# Patient Record
Sex: Female | Born: 1965 | Race: White | Hispanic: No | Marital: Married | Smoking: Former smoker
Health system: Southern US, Community
[De-identification: ages and names within clinical notes are randomized; demographics above are authoritative.]

## PROBLEM LIST (undated history)

## (undated) DIAGNOSIS — T7840XA Allergy, unspecified, initial encounter: Secondary | ICD-10-CM

## (undated) DIAGNOSIS — F329 Major depressive disorder, single episode, unspecified: Secondary | ICD-10-CM

## (undated) DIAGNOSIS — F32A Depression, unspecified: Secondary | ICD-10-CM

## (undated) DIAGNOSIS — E119 Type 2 diabetes mellitus without complications: Secondary | ICD-10-CM

## (undated) DIAGNOSIS — H269 Unspecified cataract: Secondary | ICD-10-CM

## (undated) DIAGNOSIS — J45909 Unspecified asthma, uncomplicated: Secondary | ICD-10-CM

## (undated) HISTORY — DX: Depression, unspecified: F32.A

## (undated) HISTORY — DX: Major depressive disorder, single episode, unspecified: F32.9

## (undated) HISTORY — DX: Allergy, unspecified, initial encounter: T78.40XA

## (undated) HISTORY — DX: Type 2 diabetes mellitus without complications: E11.9

## (undated) HISTORY — DX: Unspecified asthma, uncomplicated: J45.909

## (undated) HISTORY — PX: APPENDECTOMY: SHX54

## (undated) HISTORY — PX: EYE SURGERY: SHX253

## (undated) HISTORY — PX: TONSILLECTOMY: SUR1361

## (undated) HISTORY — DX: Unspecified cataract: H26.9

---

## 2005-06-05 ENCOUNTER — Ambulatory Visit: Payer: Self-pay | Admitting: Internal Medicine

## 2011-11-25 ENCOUNTER — Ambulatory Visit: Payer: Self-pay | Admitting: Ophthalmology

## 2011-11-25 LAB — PREGNANCY, URINE: Pregnancy Test, Urine: NEGATIVE m[IU]/mL

## 2013-02-18 ENCOUNTER — Ambulatory Visit (INDEPENDENT_AMBULATORY_CARE_PROVIDER_SITE_OTHER): Payer: No Typology Code available for payment source | Admitting: Internal Medicine

## 2013-02-18 VITALS — BP 129/85 | HR 86 | Temp 97.9°F | Resp 18 | Ht 64.5 in | Wt 194.0 lb

## 2013-02-18 DIAGNOSIS — R51 Headache: Secondary | ICD-10-CM

## 2013-02-18 DIAGNOSIS — L02818 Cutaneous abscess of other sites: Secondary | ICD-10-CM

## 2013-02-18 DIAGNOSIS — L03811 Cellulitis of head [any part, except face]: Secondary | ICD-10-CM

## 2013-02-18 DIAGNOSIS — E119 Type 2 diabetes mellitus without complications: Secondary | ICD-10-CM | POA: Insufficient documentation

## 2013-02-18 LAB — POCT CBC
Granulocyte percent: 77.4 %G (ref 37–80)
HCT, POC: 46.4 % (ref 37.7–47.9)
Hemoglobin: 14.6 g/dL (ref 12.2–16.2)
MCV: 99.1 fL — AB (ref 80–97)
POC LYMPH PERCENT: 17.2 %L (ref 10–50)
RDW, POC: 13.8 %

## 2013-02-18 LAB — POCT GLYCOSYLATED HEMOGLOBIN (HGB A1C): Hemoglobin A1C: 11.1

## 2013-02-18 MED ORDER — KETOCONAZOLE 2 % EX SHAM: MEDICATED_SHAMPOO | CUTANEOUS | Status: AC

## 2013-02-18 MED ORDER — DOXYCYCLINE HYCLATE 100 MG PO TABS: 100.0000 mg | ORAL_TABLET | Freq: Two times a day (BID) | ORAL | Status: DC

## 2013-02-18 MED ORDER — CEFTRIAXONE SODIUM 1 G IJ SOLR
1.0000 g | Freq: Once | INTRAMUSCULAR | Status: AC
  Administered 2013-02-18: 1 g via INTRAMUSCULAR

## 2013-02-18 MED ORDER — HYDROCODONE-ACETAMINOPHEN 5-325 MG PO TABS: 1.0000 | ORAL_TABLET | Freq: Four times a day (QID) | ORAL | Status: AC | PRN

## 2013-02-18 NOTE — Patient Instructions (Signed)
1500 Calorie Diabetic Diet The 1500 calorie diabetic diet limits calories to 1500 each day. Following this diet and making healthy meal choices can help improve overall health. It controls blood glucose (sugar) levels and can also help lower blood pressure and cholesterol.  SERVING SIZES Measuring foods and serving sizes helps to make sure you are getting the right amount of food. The list below tells how big or small some common serving sizes are.  1 oz.........4 stacked dice.  3 oz.........Deck of cards.  1 tsp........Tip of little finger.  1 tbs........Thumb.  2 tbs........Golf ball.   cup.......Half of a fist.  1 cup........A fist. GUIDELINES FOR CHOOSING FOODS The goal of this diet is to eat a variety of foods and limit calories to 1500 each day. This can be done by choosing foods that are low in calories and fat. The diet also suggests eating small amounts of food frequently. Doing this helps control your blood glucose levels, so they do not get too high or too low. Each meal or snack may include a protein food source to help you feel more satisfied. Try to eat about the same amount of food around the same time each day. This includes weekend days, travel days, and days off work. Space your meals about 4 to 5 hours apart, and add a snack between them, if you wish.  For example, a daily food plan could include breakfast, a morning snack, lunch, dinner, and an evening snack. Healthy meals and snacks have different types of foods, including whole grains, vegetables, fruits, lean meats, poultry, fish, and dairy products. As you plan your meals, select a variety of foods. Choose from the bread and starch, vegetable, fruit, dairy, and meat/protein groups. Examples of foods from each group are listed below, with their suggested serving sizes. Use measuring cups and spoons to become familiar with what a healthy portion looks like. Bread and Starch Each serving equals 15 grams of  carbohydrate.  1 slice bread.   bagel.   cup cold cereal (unsweetened).   cup hot cereal or mashed potatoes.  1 small potato (size of a computer mouse).   cup cooked pasta or rice.   English muffin.  1 cup broth-based soup.  3 cups of popcorn.  4 to 6 whole-wheat crackers.   cup cooked beans, peas, or corn. Vegetables Each serving equals 5 grams of carbohydrate.   cup cooked vegetables.  1 cup raw vegetables.   cup tomato or vegetable juice. Fruit Each serving equals 15 grams of carbohydrate.  1 small apple or orange.  1  cup watermelon or strawberries.   cup applesauce (no sugar added).  2 tbs raisins.   banana.   cup canned fruit, packed in water or in its own juice.   cup unsweetened fruit juice. Dairy Each serving equals 12 to 15 grams of carbohydrate.  1 cup fat-free milk.  6 oz artificially sweetened yogurt or plain yogurt.  1 cup low-fat buttermilk.  1 cup soy milk.  1 cup almond milk. Meat/Protein  1 large egg.  2 to 3 oz meat, poultry, or fish.   cup low-fat cottage cheese.  1 tbs peanut butter.  1 oz low-fat cheese.   cup tuna, packed in water.   cup tofu. Fat  1 tsp oil.  1 tsp trans-fat-free margarine.  1 tsp butter.  1 tsp mayonnaise.  2 tbs avocado.  1 tbs salad dressing.  1 tbs cream cheese.  2 tbs sour cream. SAMPLE 1500 CALORIE DIET   PLAN Breakfast   whole-wheat English muffin (1 carb serving).  1 tsp trans-fat-free margarine.  1 scrambled egg.  1 cup fat-free milk (1 carb serving).  1 small orange (1 carb serving). Lunch  Chicken wrap.  1 whole-wheat tortilla, 8-inch (1 carb servings).  2 oz chicken breast, sliced.  2 tbs low-fat salad dressing, such as [place].   cup shredded lettuce.  2 slices tomato.   cup carrot sticks.  1 small apple (1 carb serving). Afternoon Snack  3 graham cracker squares (1 carb serving).  1 tbs peanut butter. Dinner  2 oz lean  pork chop, broiled.  1 cup brown rice (3 carb servings).   cup steamed carrots.   cup green beans.  1 cup fat-free milk (1 carb serving).  1 tsp trans-fat-free margarine. Evening Snack   cup low-fat cottage cheese.  1 small peach or pear, sliced (or  cup canned in water) (1 carb serving). MEAL PLAN You can use this worksheet to help you make a daily meal plan based on the 1500 calorie diabetic diet suggestions. If you are using this plan to help you control your blood glucose, you may interchange carbohydrate containing foods (dairy, starches, and fruits). Select a variety of fresh foods of varying colors and flavors. The total amount of carbohydrate in your meals or snacks is more important than making sure you include all of the food groups every time you eat. You can choose from approximately this many of the following foods to build your day's meals:  6 Starches.  3 Vegetables.  2 Fruits.  2 Dairy.  4 to 6 oz Meat/Protein.  Up to 3 Fats. Your dietician can use this worksheet to help you decide how many servings and which types of foods are right for you. BREAKFAST Food Group and Servings / Food Choice Starch _________________________________________________________ Dairy __________________________________________________________ Fruit ___________________________________________________________ Meat/Protein____________________________________________________ Fat ____________________________________________________________ LUNCH Food Group and Servings / Food Choice  Starch _________________________________________________________ Meat/Protein ___________________________________________________ Vegetables _____________________________________________________ Fruit __________________________________________________________ Dairy __________________________________________________________ Fat ____________________________________________________________ Aura Fey Food Group and Servings / Food Choice Dairy __________________________________________________________ Starch _________________________________________________________ Meat/Protein____________________________________________________ Zada Girt ___________________________________________________________ Laural Golden Food Group and Servings / Food Choice Starch _________________________________________________________ Meat/Protein ___________________________________________________ Dairy __________________________________________________________ Vegetable ______________________________________________________ Fruit ___________________________________________________________ Fat ____________________________________________________________ Lollie Sails Food Group and Servings / Food Choice Fruit ___________________________________________________________ Meat/Protein ____________________________________________________ Dairy __________________________________________________________ Starch __________________________________________________________ DAILY TOTALS Starches _________________________ Vegetables _______________________ Fruits ____________________________ Dairy ____________________________ Meat/Protein_____________________ Fats _____________________________ Document Released: 03/17/2005 Document Revised: 11/17/2011 Document Reviewed: 07/12/2009 ExitCare Patient Information 2014 [employer]. Cellulitis Cellulitis is an infection of the skin and the tissue beneath it. The infected area is usually red and tender. Cellulitis occurs most often in the arms and lower legs.  CAUSES  Cellulitis is caused by bacteria that enter the skin through cracks or cuts in the skin. The most common types of bacteria that cause cellulitis are Staphylococcus and Streptococcus. SYMPTOMS   Redness and warmth.  Swelling.  Tenderness or pain.  Fever. DIAGNOSIS  Your caregiver can usually  determine what is wrong based on a physical exam. Blood tests may also be done. TREATMENT  Treatment usually involves taking an antibiotic medicine. HOME CARE INSTRUCTIONS   Take your antibiotics as directed. Finish them even if you start to feel better.  Keep the infected arm or leg elevated to reduce swelling.  Apply a warm cloth to the affected area up to 4 times per day to relieve pain.  Only take over-the-counter or prescription medicines for pain, discomfort, or fever as directed by your caregiver.  Keep all follow-up appointments as directed by your caregiver. SEEK MEDICAL CARE IF:   You notice red streaks coming from the infected area.  Your red area gets larger or turns dark in color.  Your bone or joint underneath the infected area becomes painful after the skin has healed.  Your infection returns in the same area or another area.  You notice a swollen bump in the infected area.  You develop new symptoms. SEEK IMMEDIATE MEDICAL CARE IF:   You have a fever.  You feel very sleepy.  You develop vomiting or diarrhea.  You have a general ill feeling (malaise) with muscle aches and pains. MAKE SURE YOU:   Understand these instructions.  Will watch your condition.  Will get help right away if you are not doing well or get worse. Document Released: 06/04/2005 Document Revised: 02/24/2012 Document Reviewed: 11/10/2011 [facility] Patient Information 2014 [place], [place].

## 2013-02-18 NOTE — Progress Notes (Signed)
  Subjective:    Patient ID: Christy Cole, female    DOB: 1966/08/26, 47 y.o.   MRN: [id number]  HPI Has past hx of staph and is diabetic on metformin. Patient here today with a rash on the back of her scalp.  Patient states it has been there for about one month.  Rash is very painful.      No fever, does have red bumps coming out in may places. Review of Systems     Objective:   Physical Exam  Vitals reviewed. Constitutional: She is oriented to person, place, and time. She appears well-developed and well-nourished.  Eyes: EOM are normal.  Pulmonary/Chest: Effort normal.  Neurological: She is alert and oriented to person, place, and time.  Skin: Rash noted.  Psychiatric: She has a normal mood and affect.   Purulent discharge from tender lesion on occipital scalp. CS done Cellulitis localized, does have satellite pustules neck and face  Results for orders placed in visit on 02/18/13  POCT CBC      Result Value Range   WBC 10.1  4.6 - 10.2 K/uL   Lymph, poc 1.7  0.6 - 3.4   POC LYMPH PERCENT 17.2  10 - 50 %L   MID (cbc) 0.5  0 - 0.9   POC MID % 5.4  0 - 12 %M   POC Granulocyte 7.8 (*) 2 - 6.9   Granulocyte percent 77.4  37 - 80 %G   RBC 4.68  4.04 - 5.48 M/uL   Hemoglobin 14.6  12.2 - 16.2 g/dL   HCT, POC 40.9  81.1 - 47.9 %   MCV 99.1 (*) 80 - 97 fL   MCH, POC 31.2  27 - 31.2 pg   MCHC 31.5 (*) 31.8 - 35.4 g/dL   RDW, POC 91.4     Platelet Count, POC 246  142 - 424 K/uL   MPV 9.9  0 - 99.8 fL  GLUCOSE, POCT (MANUAL RESULT ENTRY)      Result Value Range   POC Glucose 369 (*) 70 - 99 mg/dl  POCT GLYCOSYLATED HEMOGLOBIN (HGB A1C)      Result Value Range   Hemoglobin A1C 11.1          Assessment & Plan:  Diabetes not controlled/d glucoseMust see your MD this week Cellulitis scalp/Pain scalp Return Sunday 3pm repeat rocephin Doxycycline 100mg  bid/Vicodin 5/325/Nizoral shampoo Metformin increase to 1g bid

## 2013-02-20 ENCOUNTER — Ambulatory Visit (INDEPENDENT_AMBULATORY_CARE_PROVIDER_SITE_OTHER): Payer: No Typology Code available for payment source | Admitting: Internal Medicine

## 2013-02-20 VITALS — BP 122/70 | HR 84 | Temp 98.1°F | Resp 16 | Ht 64.0 in | Wt 194.0 lb

## 2013-02-20 DIAGNOSIS — L03818 Cellulitis of other sites: Secondary | ICD-10-CM

## 2013-02-20 DIAGNOSIS — L02811 Cutaneous abscess of head [any part, except face]: Secondary | ICD-10-CM

## 2013-02-20 MED ORDER — CEFTRIAXONE SODIUM 1 G IJ SOLR
1.0000 g | Freq: Once | INTRAMUSCULAR | Status: AC
  Administered 2013-02-20: 1 g via INTRAMUSCULAR

## 2013-02-20 NOTE — Progress Notes (Signed)
  Subjective:    Patient ID: Christy Cole, female    DOB: 1965/09/22, 47 y.o.   MRN: [id number]  HPI Feeling better, draining more today, no fever and less tender.   Review of Systems     Objective:   Physical Exam Still cpoious purulence Culture staph but not mrsa  Puncture ID/drainage improved     Assessment & Plan:  Rocephin 1g Compresses/doxy Return 48hrs/Will drain and pack if not better

## 2013-02-22 LAB — WOUND CULTURE: Gram Stain: NONE SEEN

## 2013-03-18 LAB — FUNGUS CULTURE W SMEAR: Smear Result: NONE SEEN

## 2013-05-19 ENCOUNTER — Ambulatory Visit (INDEPENDENT_AMBULATORY_CARE_PROVIDER_SITE_OTHER): Payer: No Typology Code available for payment source | Admitting: Emergency Medicine

## 2013-05-19 VITALS — BP 140/90 | HR 77 | Temp 99.0°F | Resp 16 | Ht 65.0 in | Wt 193.6 lb

## 2013-05-19 DIAGNOSIS — Z2089 Contact with and (suspected) exposure to other communicable diseases: Secondary | ICD-10-CM

## 2013-05-19 DIAGNOSIS — Z202 Contact with and (suspected) exposure to infections with a predominantly sexual mode of transmission: Secondary | ICD-10-CM

## 2013-05-19 DIAGNOSIS — A5901 Trichomonal vulvovaginitis: Secondary | ICD-10-CM

## 2013-05-19 MED ORDER — METRONIDAZOLE 500 MG PO TABS: ORAL_TABLET | ORAL | Status: DC

## 2013-05-19 NOTE — Patient Instructions (Addendum)
Trichomoniasis °Trichomoniasis is an infection, caused by the Trichomonas organism, that affects both women and men. In women, the outer female genitalia and the vagina are affected. In men, the penis is mainly affected, but the prostate and other reproductive organs can also be involved. Trichomoniasis is a sexually transmitted disease (STD) and is most often passed to another person through sexual contact. The majority of people who get trichomoniasis do so from a sexual encounter and are also at risk for other STDs. °CAUSES  °· Sexual intercourse with an infected partner. °· It can be present in swimming pools or hot tubs. °SYMPTOMS  °· Abnormal gray-green frothy vaginal discharge in women. °· Vaginal itching and irritation in women. °· Itching and irritation of the area outside the vagina in women. °· Penile discharge with or without pain in males. °· Inflammation of the urethra (urethritis), causing painful urination. °· Bleeding after sexual intercourse. °RELATED COMPLICATIONS °· Pelvic inflammatory disease. °· Infection of the uterus (endometritis). °· Infertility. °· Tubal (ectopic) pregnancy. °· It can be associated with other STDs, including gonorrhea and chlamydia, hepatitis B, and HIV. °COMPLICATIONS DURING PREGNANCY °· Early (premature) delivery. °· Premature rupture of the membranes (PROM). °· Low birth weight. °DIAGNOSIS  °· Visualization of Trichomonas under the microscope from the vagina discharge. °· Ph of the vagina greater than 4.5, tested with a test tape. °· Trich Rapid Test. °· Culture of the organism, but this is not usually needed. °· It may be found on a Pap test. °· Having a "strawberry cervix,"which means the cervix looks very red like a strawberry. °TREATMENT  °· You may be given medication to fight the infection. Inform your caregiver if you could be or are pregnant. Some medications used to treat the infection should not be taken during pregnancy. °· Over-the-counter medications or  creams to decrease itching or irritation may be recommended. °· Your sexual partner will need to be treated if infected. °HOME CARE INSTRUCTIONS  °· Take all medication prescribed by your caregiver. °· Take over-the-counter medication for itching or irritation as directed by your caregiver. °· Do not have sexual intercourse while you have the infection. °· Do not douche or wear tampons. °· Discuss your infection with your partner, as your partner may have acquired the infection from you. Or, your partner may have been the person who transmitted the infection to you. °· Have your sex partner examined and treated if necessary. °· Practice safe, informed, and protected sex. °· See your caregiver for other STD testing. °SEEK MEDICAL CARE IF:  °· You still have symptoms after you finish the medication. °· You have an oral temperature above 102° F (38.9° C). °· You develop belly (abdominal) pain. °· You have pain when you urinate. °· You have bleeding after sexual intercourse. °· You develop a rash. °· The medication makes you sick or makes you throw up (vomit). °Document Released: 02/18/2001 Document Revised: 11/17/2011 Document Reviewed: 03/16/2009 °ExitCare® Patient Information ©2014 [employer]. ° °

## 2013-05-19 NOTE — Progress Notes (Signed)
[facility] [address] [place] [address] [phone]  Date:  05/19/2013   Name:  Christy Cole   DOB:  07-16-1966   MRN:  [id number]  PCP:  No PCP Per Patient    Chief Complaint: Vaginal Itching   History of Present Illness:  Christy Cole is a 47 y.o. very pleasant female patient who presents with the following:  Patient's boyfriend and his wife have both been treated for trichomonas vaginitis.  She has a discharge and mild itching.  Has no dyspareunia.  No dysuria, urgency or frequency.  No fever or chills.  No abdominal or pelvic pain.  Denies other complaint or health concern today.   Patient Active Problem List   Diagnosis Date Noted  . Diabetes 02/18/2013    Past Medical History  Diagnosis Date  . Allergy   . Depression   . Asthma   . Cataract   . Diabetes mellitus without complication     Past Surgical History  Procedure Laterality Date  . Appendectomy    . Eye surgery      History  Substance Use Topics  . Smoking status: Former Games developer  . Smokeless tobacco: Not on file  . Alcohol Use: No    Family History  Problem Relation Age of Onset  . Hypertension Mother   . Diabetes Father     No Known Allergies  Medication list has been reviewed and updated.  Current Outpatient Prescriptions on File Prior to Visit  Medication Sig Dispense Refill  . metFORMIN (GLUCOPHAGE) 1000 MG tablet Take 1,000 mg by mouth 2 (two) times daily with a meal.      . doxycycline (VIBRA-TABS) 100 MG tablet Take 1 tablet (100 mg total) by mouth 2 (two) times daily.  20 tablet  0  . HYDROcodone-acetaminophen (NORCO/VICODIN) 5-325 MG per tablet Take 1 tablet by mouth every 6 (six) hours as needed for pain.  30 tablet  0  . ketoconazole (NIZORAL) 2 % shampoo Apply topically 2 (two) times a week.  120 mL  0   No current facility-administered medications on file prior to visit.    Review of Systems:  .previ   Physical Examination: Filed Vitals:    05/19/13 2005  BP: 140/90  Pulse: 77  Temp: 99 F (37.2 C)  Resp: 16   Filed Vitals:   05/19/13 2005  Height: 5\' 5"  (1.651 m)  Weight: 193 lb 9.6 oz (87.816 kg)   Body mass index is 32.22 kg/(m^2). Ideal Body Weight: Weight in (lb) to have BMI = 25: 149.9   GEN: WDWN, NAD, Non-toxic, Alert & Oriented x 3 HEENT: Atraumatic, Normocephalic.  Ears and Nose: No external deformity. EXTR: No clubbing/cyanosis/edema NEURO: Normal gait.  PSYCH: Normally interactive. Conversant. Not depressed or anxious appearing.  Calm demeanor.    Assessment and Plan: Trichomonas  Flagyl   Signed,  Phillips Odor, MD

## 2013-10-04 ENCOUNTER — Ambulatory Visit (INDEPENDENT_AMBULATORY_CARE_PROVIDER_SITE_OTHER): Payer: No Typology Code available for payment source | Admitting: Emergency Medicine

## 2013-10-04 VITALS — BP 124/76 | HR 110 | Temp 99.0°F | Resp 17 | Ht 63.5 in | Wt 176.0 lb

## 2013-10-04 DIAGNOSIS — M7918 Myalgia, other site: Secondary | ICD-10-CM

## 2013-10-04 DIAGNOSIS — IMO0001 Reserved for inherently not codable concepts without codable children: Secondary | ICD-10-CM

## 2013-10-04 DIAGNOSIS — L0291 Cutaneous abscess, unspecified: Secondary | ICD-10-CM

## 2013-10-04 DIAGNOSIS — L0233 Carbuncle of buttock: Secondary | ICD-10-CM

## 2013-10-04 DIAGNOSIS — L0232 Furuncle of buttock: Secondary | ICD-10-CM

## 2013-10-04 DIAGNOSIS — L0390 Cellulitis, unspecified: Secondary | ICD-10-CM

## 2013-10-04 MED ORDER — SULFAMETHOXAZOLE-TMP DS 800-160 MG PO TABS: 1.0000 | ORAL_TABLET | Freq: Two times a day (BID) | ORAL | Status: DC

## 2013-10-04 MED ORDER — ACETAMINOPHEN-CODEINE #3 300-30 MG PO TABS: 1.0000 | ORAL_TABLET | ORAL | Status: AC | PRN

## 2013-10-04 NOTE — Patient Instructions (Signed)
Abscess An abscess is an infected area that contains a collection of pus and debris.It can occur in almost any part of the body. An abscess is also known as a furuncle or boil. CAUSES  An abscess occurs when tissue gets infected. This can occur from blockage of oil or sweat glands, infection of hair follicles, or a minor injury to the skin. As the body tries to fight the infection, pus collects in the area and creates pressure under the skin. This pressure causes pain. People with weakened immune systems have difficulty fighting infections and get certain abscesses more often.  SYMPTOMS Usually an abscess develops on the skin and becomes a painful mass that is red, warm, and tender. If the abscess forms under the skin, you may feel a moveable soft area under the skin. Some abscesses break open (rupture) on their own, but most will continue to get worse without care. The infection can spread deeper into the body and eventually into the bloodstream, causing you to feel ill.  DIAGNOSIS  Your caregiver will take your medical history and perform a physical exam. A sample of fluid may also be taken from the abscess to determine what is causing your infection. TREATMENT  Your caregiver may prescribe antibiotic medicines to fight the infection. However, taking antibiotics alone usually does not cure an abscess. Your caregiver may need to make a small cut (incision) in the abscess to drain the pus. In some cases, gauze is packed into the abscess to reduce pain and to continue draining the area. HOME CARE INSTRUCTIONS   Only take over-the-counter or prescription medicines for pain, discomfort, or fever as directed by your caregiver.  If you were prescribed antibiotics, take them as directed. Finish them even if you start to feel better.  If gauze is used, follow your caregiver's directions for changing the gauze.  To avoid spreading the infection:  Keep your draining abscess covered with a  bandage.  Wash your hands well.  Do not share personal care items, towels, or whirlpools with others.  Avoid skin contact with others.  Keep your skin and clothes clean around the abscess.  Keep all follow-up appointments as directed by your caregiver. SEEK MEDICAL CARE IF:   You have increased pain, swelling, redness, fluid drainage, or bleeding.  You have muscle aches, chills, or a general ill feeling.  You have a fever. MAKE SURE YOU:   Understand these instructions.  Will watch your condition.  Will get help right away if you are not doing well or get worse. Document Released: 06/04/2005 Document Revised: 02/24/2012 Document Reviewed: 11/07/2011 Exit[employer] Patient Information 2014 [employer].  

## 2013-10-04 NOTE — Progress Notes (Signed)
I was directly involved in the patient's care and actively participated during the procedure.  

## 2013-10-04 NOTE — Progress Notes (Signed)
[facility] [address], [place] [id number] [phone]  Date:  10/04/2013   Name:  Christy Cole   DOB:  23-Apr-1966   MRN:  [id number]  PCP:  No PCP Per Patient    Chief Complaint: Headache, Nausea, Emesis, Generalized Body Aches and boil on back of leg   History of Present Illness:  Christy Cole is a 48 y.o. very pleasant female patient who presents with the following:  Last Wednesday developed a red, tender area on the left buttock.  Has worsened, with fever, myalgias and arthralgias and fatigue.  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  Concerned she may have the flu. No improvement with over the counter medications or other home remedies. Denies other complaint or health concern today.   Patient Active Problem List   Diagnosis Date Noted  . Diabetes 02/18/2013    Past Medical History  Diagnosis Date  . Allergy   . Depression   . Asthma   . Cataract   . Diabetes mellitus without complication     Past Surgical History  Procedure Laterality Date  . Appendectomy    . Eye surgery      History  Substance Use Topics  . Smoking status: Former Games developermoker  . Smokeless tobacco: Not on file  . Alcohol Use: No    Family History  Problem Relation Age of Onset  . Hypertension Mother   . Diabetes Father     No Known Allergies  Medication list has been reviewed and updated.  Current Outpatient Prescriptions on File Prior to Visit  Medication Sig Dispense Refill  . doxycycline (VIBRA-TABS) 100 MG tablet Take 1 tablet (100 mg total) by mouth 2 (two) times daily.  20 tablet  0  . HYDROcodone-acetaminophen (NORCO/VICODIN) 5-325 MG per tablet Take 1 tablet by mouth every 6 (six) hours as needed for pain.  30 tablet  0  . ketoconazole (NIZORAL) 2 % shampoo Apply topically 2 (two) times a week.  120 mL  0  . metFORMIN (GLUCOPHAGE) 1000 MG tablet Take 1,000 mg by mouth 2 (two) times daily with a meal.      . metroNIDAZOLE (FLAGYL) 500 MG tablet Take  four tablets together in one dose.  DO NOT CONSUME ALCOHOL WHILE TAKING THIS MEDICATION.  4 tablet  0   No current facility-administered medications on file prior to visit.    Review of Systems:  As per HPI, otherwise negative.    Physical Examination: Filed Vitals:   10/04/13 1253  BP: 124/76  Pulse: 110  Temp: 99 F (37.2 C)  Resp: 17   Filed Vitals:   10/04/13 1253  Height: 5' 3.5" (1.613 m)  Weight: 176 lb (79.833 kg)   Body mass index is 30.68 kg/(m^2). Ideal Body Weight: Weight in (lb) to have BMI = 25: 143.1   GEN: WDWN, NAD, Non-toxic, Alert & Oriented x 3 HEENT: Atraumatic, Normocephalic.  Ears and Nose: No external deformity. EXTR: No clubbing/cyanosis/edema NEURO: Normal gait.  PSYCH: Normally interactive. Conversant. Not depressed or anxious appearing.  Calm demeanor.  Left buttock:  Large abscess with cellulitis surrounding on left buttock.    Assessment and Plan: Abscess Septra I & D   Signed,  Phillips OdorJeffery Anderson, MD

## 2013-10-04 NOTE — Progress Notes (Signed)
Procedure:  VCO. Local anesthesia with 2% lidocaine. #11 Blade used to make 2 cm incision into fluctuant of abscess. Copious purulent drainage expressed. Abscess pore with necrotic tissue at edges, this tissue was debrided. Wound was explored for possible loculations and packed with 1/4 inch plain. Wound tracked inferior to incision. Erythematous borders marked. Dressing placed.

## 2013-10-06 ENCOUNTER — Ambulatory Visit (INDEPENDENT_AMBULATORY_CARE_PROVIDER_SITE_OTHER): Payer: No Typology Code available for payment source | Admitting: Physician Assistant

## 2013-10-06 VITALS — BP 110/70 | HR 86 | Temp 97.6°F | Resp 18 | Ht 63.5 in | Wt 173.0 lb

## 2013-10-06 DIAGNOSIS — L03119 Cellulitis of unspecified part of limb: Secondary | ICD-10-CM

## 2013-10-06 DIAGNOSIS — L02419 Cutaneous abscess of limb, unspecified: Secondary | ICD-10-CM

## 2013-10-06 MED ORDER — DOXYCYCLINE HYCLATE 100 MG PO TABS: 100.0000 mg | ORAL_TABLET | Freq: Two times a day (BID) | ORAL | Status: AC

## 2013-10-06 NOTE — Progress Notes (Signed)
   Subjective:    Patient ID: Christy Cole, female    DOB: Jan 28, 1966, 48 y.o.   MRN: [id number]  HPI 48 year old female presents for wound check s/p I&D of a buttocks abscess on 10/04/13.  Noticed a lot of purulence on the bandage today but until then it had not drained much.  Reports it feels much better and the redness has decreased significantly. Pain has also improved.  Does admits she has a slight headache and has also noticed a rash around her nose and mouth that is very dry and is peeling.  No hx of drug allergies. Denies any oral lesions or rash anywhere else on her body.  No nausea, vomiting, fever or chills.     Review of Systems  Constitutional: Negative for fever and chills.  Gastrointestinal: Negative for nausea and vomiting.  Skin: Positive for rash and wound. Negative for color change.  Neurological: Positive for headaches.       Objective:   Physical Exam  Constitutional: She is oriented to person, place, and time. She appears well-developed and well-nourished.  HENT:  Head: Normocephalic and atraumatic.    Right Ear: External ear normal.  Left Ear: External ear normal.  Mouth/Throat: Oropharynx is clear and moist.  Noted area has erythematous, peeling rash. No vesicles, induration, or warmth.   Eyes: Conjunctivae are normal.  Neck: Normal range of motion.  Cardiovascular: Normal rate.   Pulmonary/Chest: Effort normal.  Neurological: She is alert and oriented to person, place, and time.  Skin:     Wound about 2 cm in size.  There is some white adherent material on inside border of wound.  Border marked with skin scribe from previous visit has no erythema up to edges. Only small amount of erythema around wound.  No induration or warmth noted.    Psychiatric: She has a normal mood and affect. Her behavior is normal. Judgment and thought content normal.    Procedure: Dressing and packing removed. Irrigated with 5 cc of 2% plain lidocaine.  Moderate amount of  purulence expressed. Wound edges debrided.  Repacked with 1/4 plain packing.  Dressing applied.       Assessment & Plan:  Cellulitis and abscess of leg - Plan: doxycycline (VIBRA-TABS) 100 MG tablet  Stop Septra due to eruption of rash.   Start doxycycline 100 mg bid x 10 days Culture still pending Continue daily dressing changes RTC precautions discussed.  Follow up in 48 hours.

## 2013-10-07 LAB — WOUND CULTURE: Gram Stain: NONE SEEN

## 2013-10-08 ENCOUNTER — Ambulatory Visit (INDEPENDENT_AMBULATORY_CARE_PROVIDER_SITE_OTHER): Payer: No Typology Code available for payment source | Admitting: Physician Assistant

## 2013-10-08 VITALS — BP 118/70 | HR 91 | Temp 97.9°F | Resp 18 | Wt 176.0 lb

## 2013-10-08 DIAGNOSIS — L03119 Cellulitis of unspecified part of limb: Principal | ICD-10-CM

## 2013-10-08 DIAGNOSIS — L02419 Cutaneous abscess of limb, unspecified: Secondary | ICD-10-CM

## 2013-10-08 DIAGNOSIS — T887XXA Unspecified adverse effect of drug or medicament, initial encounter: Secondary | ICD-10-CM

## 2013-10-08 DIAGNOSIS — T50905A Adverse effect of unspecified drugs, medicaments and biological substances, initial encounter: Secondary | ICD-10-CM

## 2013-10-08 NOTE — Progress Notes (Signed)
   Patient ID: Christy Cole MRN: [id number], DOB: 03/07/1966 48 y.o. Date of Encounter: 10/08/2013, 12:01 PM  Primary Physician: No PCP Per Patient  Chief Complaint: Wound care   See previous note  HPI: 48 y.o. female presents for wound care s/p I&D on 10/04/13 Doing well No issues or complaints Afebrile/ no chills No nausea or vomiting Septra was stopped on 10/06/13 secondary to the development of perioral erythema She was started on doxycycline on 10/06/13 Tolerating doxycyline  Pain resolved Daily dressing change Previous notes reviewed  Past Medical History  Diagnosis Date  . Allergy   . Depression   . Asthma   . Cataract   . Diabetes mellitus without complication      Home Meds: Prior to Admission medications   Medication Sig Start Date End Date Taking? Authorizing Provider  acetaminophen-codeine (TYLENOL #3) 300-30 MG per tablet Take 1-2 tablets by mouth every 4 (four) hours as needed. 10/04/13  Yes Phillips OdorJeffery Anderson, MD  doxycycline (VIBRA-TABS) 100 MG tablet Take 1 tablet (100 mg total) by mouth 2 (two) times daily. 10/06/13  Yes Heather M Marte, PA-C  metFORMIN (GLUCOPHAGE) 1000 MG tablet Take 1,000 mg by mouth 2 (two) times daily with a meal.   Yes Historical Provider, MD  HYDROcodone-acetaminophen (NORCO/VICODIN) 5-325 MG per tablet Take 1 tablet by mouth every 6 (six) hours as needed for pain. 02/18/13   Jonita Albeehris W Guest, MD  ketoconazole (NIZORAL) 2 % shampoo Apply topically 2 (two) times a week. 02/18/13   Jonita Albeehris W Guest, MD    Allergies:  Allergies  Allergen Reactions  . Bactrim [Sulfamethoxazole-Tmp Ds] Rash    ROS: Constitutional: Afebrile, no chills Cardiovascular: negative for chest pain or palpitations Dermatological: Positive for wound. Negative for erythema, pain, or warmth  GI: No nausea or vomiting   EXAM: Physical Exam: Blood pressure 118/70, pulse 91, temperature 97.9 F (36.6 C), temperature source Oral, resp. rate 18, weight 176 lb (79.833  kg), last menstrual period 10/01/2013, SpO2 97.00%., Body mass index is 30.68 kg/(m^2). General: Well developed, well nourished, in no acute distress. Nontoxic appearing. Head: Normocephalic, atraumatic, sclera non-icteric.  Neck: Supple. Lungs: Breathing is unlabored. Heart: Normal rate. Skin:  Warm and moist. Dressing and packing in place. No induration, erythema, or tenderness to palpation. Skin marker line in place. There is no erythema present. Along the periorbital region there is resolving erythema. The patient states this is improved.  Neuro: Alert and oriented X 3. Moves all extremities spontaneously. Normal gait.  Psych:  Responds to questions appropriately with a normal affect.   PROCEDURE: Dressing and packing removed. Small amount of purulence expressed Wound bed and wound edges debrided Irrigated with 1% plain lidocaine 5 cc. Repacked with 1/4 inch plain packing Dressing applied  LAB: Culture: MRSA, doxycycline sensitive  A/P: 48 y.o. female with cellulitis/abscess as above s/p I&D on 10/04/13 and resolved drug reaction  1) Wound care -Wound care per above -Continue doxycycline -Pain well controlled -Daily dressing changes -Recheck 48 hours  2) Drug reaction -Resolved -Avoid sulfa medications  Signed, Eula Listenyan Viveka Wilmeth, MHS, PA-C [facility] [place], [place] [id number] [phone] [facility] 10/08/2013 12:01 PM

## 2013-10-10 ENCOUNTER — Ambulatory Visit (INDEPENDENT_AMBULATORY_CARE_PROVIDER_SITE_OTHER): Payer: No Typology Code available for payment source | Admitting: Physician Assistant

## 2013-10-10 VITALS — BP 140/78 | HR 92 | Temp 97.4°F | Resp 16 | Ht 64.5 in | Wt 180.0 lb

## 2013-10-10 DIAGNOSIS — L03317 Cellulitis of buttock: Secondary | ICD-10-CM

## 2013-10-10 DIAGNOSIS — B373 Candidiasis of vulva and vagina: Secondary | ICD-10-CM

## 2013-10-10 DIAGNOSIS — L0231 Cutaneous abscess of buttock: Secondary | ICD-10-CM

## 2013-10-10 DIAGNOSIS — B3731 Acute candidiasis of vulva and vagina: Secondary | ICD-10-CM

## 2013-10-10 MED ORDER — FLUCONAZOLE 150 MG PO TABS: 150.0000 mg | ORAL_TABLET | Freq: Once | ORAL | Status: AC

## 2013-10-10 NOTE — Progress Notes (Signed)
   Subjective:    Patient ID: Christy Cole, female    DOB: [birth year], 48 y.o.   MRN: [id number]  HPI   Christy Cole is a very pleasant 48 yr old female here for wound care following I&D of a buttock abscess on 10/04/13.  Pt states she is much improved.  Changed from Septra to doxy at last visit due to probable drug reaction.  Pt tolerating this well but has developed a yeast infection.  The area continues to drain, and she continues to change the dressing frequently.   Review of Systems  Constitutional: Negative for fever and chills.  Respiratory: Negative.   Cardiovascular: Negative.   Gastrointestinal: Negative.   Genitourinary: Positive for vaginal discharge.  Skin: Positive for wound. Negative for rash.       Objective:   Physical Exam  Vitals reviewed. Constitutional: She is oriented to person, place, and time. She appears well-developed and well-nourished. No distress.  HENT:  Head: Normocephalic and atraumatic.  Eyes: Conjunctivae are normal. No scleral icterus.  Pulmonary/Chest: Effort normal.  Neurological: She is alert and oriented to person, place, and time.  Skin: Skin is warm and dry.     Healing abscess at left buttock; no surrounding erythema, induration or warmth; initial cellulitis outline still visible and erythema has resolved completely; wound bed healthy; continues to drain thin purulent/serous drainage  Psychiatric: She has a normal mood and affect. Her behavior is normal.        Assessment & Plan:  Vaginal candidiasis - Plan: fluconazole (DIFLUCAN) 150 MG tablet  Cellulitis and abscess of buttock   Christy Cole is a very pleasant 48 yr old female here for wound care following I&D.  The wound appears to be healing very well.  Cellulitis has completely resolved.  At this point I do not think the wound requires further packing.  Continue frequent dressing changes until completely healed.  Finish abx.  RTC if concerns   E. Frances FurbishElizabeth Donelda Mailhot MHS, PA-C [facility] 2/2/20151:25 PM

## 2014-12-31 NOTE — Op Note (Signed)
PATIENT NAME:  Christy Cole, Christy Cole MR#:  [id number] DATE OF BIRTH:  11/11/1965  DATE OF PROCEDURE:  11/25/2011  PREOPERATIVE DIAGNOSIS: Visually significant cataract of the right eye.   POSTOPERATIVE DIAGNOSIS: Visually significant cataract of the right eye.   OPERATIVE PROCEDURE: Cataract extraction by phacoemulsification with implant of intraocular lens to right eye.   SURGEON: Galen ManilaWilliam Kinlee Garrison, MD.   ANESTHESIA:  1. Managed anesthesia care.  2. Topical tetracaine drops followed by 2% Xylocaine jelly applied in the preoperative holding area.   COMPLICATIONS: None.   TECHNIQUE:  Four quadrant divide-and-conquer    DESCRIPTION OF PROCEDURE: The patient was examined and consented in the preoperative holding area where the aforementioned topical anesthesia was applied to the right eye and then brought back to the Operating Room where the right eye was prepped and draped in the usual sterile ophthalmic fashion and a lid speculum was placed. A paracentesis was created with the side port blade and the anterior chamber was filled with viscoelastic. A near clear corneal incision was performed with the steel keratome. A continuous curvilinear capsulorrhexis was performed with a cystotome followed by the capsulorrhexis forceps. Hydrodissection and hydrodelineation were carried out with BSS on a blunt cannula. The lens was removed in a four quadrant divide-and-conquer technique and the remaining cortical material was removed with the irrigation-aspiration handpiece. The capsular bag was inflated with viscoelastic and the Technus ZCB00 20.0-diopter lens, serial number [id number] was placed in the capsular bag without complication. The remaining viscoelastic was removed from the eye with the irrigation-aspiration handpiece. The wounds were hydrated. The anterior chamber was flushed with Miostat and the eye was inflated to physiologic pressure. The wounds were found to be water tight. The eye was dressed with  Vigamox. The patient was given protective glasses to wear throughout the day and a shield with which to sleep tonight. The patient was also given drops with which to begin a drop regimen today and will follow-up with me in one day.   ____________________________ Christy FieldWilliam L. Hanah Moultry, MD wlp:drc D: 11/25/2011 11:54:00 ET T: 11/25/2011 12:09:29 ET JOB#: [id number]  cc: Christy Cole L. Harland Aguiniga, MD, <Dictator> Christy FieldWILLIAM L Ananda Sitzer MD ELECTRONICALLY SIGNED 11/27/2011 10:36

## 2016-01-09 ENCOUNTER — Other Ambulatory Visit: Payer: Self-pay | Admitting: Family

## 2016-01-09 DIAGNOSIS — Z1231 Encounter for screening mammogram for malignant neoplasm of breast: Secondary | ICD-10-CM

## 2016-01-23 ENCOUNTER — Ambulatory Visit: Payer: Self-pay

## 2017-07-25 ENCOUNTER — Emergency Department (HOSPITAL_COMMUNITY): Payer: Managed Care, Other (non HMO)

## 2017-07-25 ENCOUNTER — Observation Stay (HOSPITAL_COMMUNITY)
Admission: EM | Admit: 2017-07-25 | Discharge: 2017-07-27 | Disposition: A | Discharge status: Home / Self Care | Payer: Managed Care, Other (non HMO) | Attending: Internal Medicine | Admitting: Internal Medicine

## 2017-07-25 ENCOUNTER — Encounter (HOSPITAL_COMMUNITY): Payer: Self-pay

## 2017-07-25 ENCOUNTER — Other Ambulatory Visit: Payer: Self-pay

## 2017-07-25 DIAGNOSIS — Z794 Long term (current) use of insulin: Secondary | ICD-10-CM | POA: Diagnosis not present

## 2017-07-25 DIAGNOSIS — F1410 Cocaine abuse, uncomplicated: Secondary | ICD-10-CM | POA: Diagnosis not present

## 2017-07-25 DIAGNOSIS — N179 Acute kidney failure, unspecified: Secondary | ICD-10-CM | POA: Diagnosis not present

## 2017-07-25 DIAGNOSIS — R51 Headache: Secondary | ICD-10-CM | POA: Diagnosis not present

## 2017-07-25 DIAGNOSIS — E1110 Type 2 diabetes mellitus with ketoacidosis without coma: Principal | ICD-10-CM | POA: Insufficient documentation

## 2017-07-25 DIAGNOSIS — E1310 Other specified diabetes mellitus with ketoacidosis without coma: Secondary | ICD-10-CM | POA: Diagnosis not present

## 2017-07-25 DIAGNOSIS — Z79899 Other long term (current) drug therapy: Secondary | ICD-10-CM | POA: Insufficient documentation

## 2017-07-25 LAB — CBC
HCT: 51.5 % — ABNORMAL HIGH (ref 36.0–46.0)
HEMATOCRIT: 45 % (ref 36.0–46.0)
Hemoglobin: 17.2 g/dL — ABNORMAL HIGH (ref 12.0–15.0)
Hemoglobin: 15.6 g/dL — ABNORMAL HIGH (ref 12.0–15.0)
MCH: 30 pg (ref 26.0–34.0)
MCH: 30.1 pg (ref 26.0–34.0)
MCHC: 33.4 g/dL (ref 30.0–36.0)
MCHC: 34.7 g/dL (ref 30.0–36.0)
MCV: 89.9 fL (ref 78.0–100.0)
MCV: 86.9 fL (ref 78.0–100.0)
PLATELETS: 360 10*3/uL (ref 150–400)
Platelets: 314 10*3/uL (ref 150–400)
RBC: 5.73 MIL/uL — AB (ref 3.87–5.11)
RBC: 5.18 MIL/uL — ABNORMAL HIGH (ref 3.87–5.11)
RDW: 13.3 % (ref 11.5–15.5)
RDW: 13.1 % (ref 11.5–15.5)
WBC: 14.8 10*3/uL — AB (ref 4.0–10.5)
WBC: 16.6 10*3/uL — ABNORMAL HIGH (ref 4.0–10.5)

## 2017-07-25 LAB — I-STAT CG4 LACTIC ACID, ED
Lactic Acid, Venous: 2.17 mmol/L (ref 0.5–1.9)
Lactic Acid, Venous: 1.78 mmol/L (ref 0.5–1.9)

## 2017-07-25 LAB — CBG MONITORING, ED
GLUCOSE-CAPILLARY: 352 mg/dL — AB (ref 65–99)
GLUCOSE-CAPILLARY: 292 mg/dL — AB (ref 65–99)
Glucose-Capillary: 220 mg/dL — ABNORMAL HIGH (ref 65–99)
Glucose-Capillary: 436 mg/dL — ABNORMAL HIGH (ref 65–99)
Glucose-Capillary: 511 mg/dL (ref 65–99)
Glucose-Capillary: 563 mg/dL (ref 65–99)
Glucose-Capillary: >600 mg/dL (ref 65–99)
Glucose-Capillary: >600 mg/dL (ref 65–99)

## 2017-07-25 LAB — I-STAT VENOUS BLOOD GAS, ED
Acid-base deficit: 14 mmol/L — ABNORMAL HIGH (ref 0.0–2.0)
Bicarbonate: 11.6 mmol/L — ABNORMAL LOW (ref 20.0–28.0)
O2 SAT: 70 %
PCO2 VEN: 28.1 mmHg — AB (ref 44.0–60.0)
TCO2: 12 mmol/L — ABNORMAL LOW (ref 22–32)
pH, Ven: 7.223 — ABNORMAL LOW (ref 7.250–7.430)
pO2, Ven: 43 mmHg (ref 32.0–45.0)

## 2017-07-25 LAB — BASIC METABOLIC PANEL
Anion gap: 15 (ref 5–15)
BUN: 45 mg/dL — AB (ref 6–20)
CHLORIDE: 112 mmol/L — AB (ref 101–111)
CO2: 14 mmol/L — AB (ref 22–32)
Calcium: 8.8 mg/dL — ABNORMAL LOW (ref 8.9–10.3)
Creatinine, Ser: 1.46 mg/dL — ABNORMAL HIGH (ref 0.44–1.00)
GFR calc Af Amer: 46 mL/min — ABNORMAL LOW (ref >60)
GFR, EST NON AFRICAN AMERICAN: 40 mL/min — AB (ref >60)
GLUCOSE: 319 mg/dL — AB (ref 65–99)
POTASSIUM: 4.1 mmol/L (ref 3.5–5.1)
Sodium: 141 mmol/L (ref 135–145)

## 2017-07-25 LAB — I-STAT TROPONIN, ED: TROPONIN I, POC: 0.03 ng/mL (ref 0.00–0.08)

## 2017-07-25 LAB — COMPREHENSIVE METABOLIC PANEL
ALT: 16 U/L (ref 14–54)
AST: 11 U/L — ABNORMAL LOW (ref 15–41)
Albumin: 3.9 g/dL (ref 3.5–5.0)
Alkaline Phosphatase: 134 U/L — ABNORMAL HIGH (ref 38–126)
Anion gap: 24 — ABNORMAL HIGH (ref 5–15)
BUN: 39 mg/dL — ABNORMAL HIGH (ref 6–20)
CO2: 12 mmol/L — AB (ref 22–32)
CREATININE: 2.01 mg/dL — AB (ref 0.44–1.00)
Calcium: 9.1 mg/dL (ref 8.9–10.3)
Chloride: 99 mmol/L — ABNORMAL LOW (ref 101–111)
GFR, EST AFRICAN AMERICAN: 31 mL/min — AB (ref >60)
GFR, EST NON AFRICAN AMERICAN: 27 mL/min — AB (ref >60)
Glucose, Bld: 811 mg/dL (ref 65–99)
Potassium: 5.1 mmol/L (ref 3.5–5.1)
Sodium: 135 mmol/L (ref 135–145)
Total Bilirubin: 1.9 mg/dL — ABNORMAL HIGH (ref 0.3–1.2)
Total Protein: 8.4 g/dL — ABNORMAL HIGH (ref 6.5–8.1)

## 2017-07-25 LAB — RAPID URINE DRUG SCREEN, HOSP PERFORMED
Amphetamines: NOT DETECTED
BARBITURATES: NOT DETECTED
Benzodiazepines: NOT DETECTED
Cocaine: POSITIVE — AB
Opiates: NOT DETECTED
TETRAHYDROCANNABINOL: NOT DETECTED

## 2017-07-25 LAB — HEMOGLOBIN A1C
HEMOGLOBIN A1C: 12.9 % — AB (ref 4.8–5.6)
MEAN PLASMA GLUCOSE: 323.53 mg/dL

## 2017-07-25 LAB — I-STAT CHEM 8, ED
BUN: 42 mg/dL — AB (ref 6–20)
BUN: 43 mg/dL — AB (ref 6–20)
CALCIUM ION: 1.11 mmol/L — AB (ref 1.15–1.40)
CALCIUM ION: 1.12 mmol/L — AB (ref 1.15–1.40)
CHLORIDE: 112 mmol/L — AB (ref 101–111)
CREATININE: 1.1 mg/dL — AB (ref 0.44–1.00)
Chloride: 105 mmol/L (ref 101–111)
Creatinine, Ser: 1 mg/dL (ref 0.44–1.00)
GLUCOSE: 696 mg/dL — AB (ref 65–99)
HCT: 52 % — ABNORMAL HIGH (ref 36.0–46.0)
HCT: 47 % — ABNORMAL HIGH (ref 36.0–46.0)
HEMOGLOBIN: 17.7 g/dL — AB (ref 12.0–15.0)
Hemoglobin: 16 g/dL — ABNORMAL HIGH (ref 12.0–15.0)
Potassium: 5.2 mmol/L — ABNORMAL HIGH (ref 3.5–5.1)
Potassium: 4.8 mmol/L (ref 3.5–5.1)
SODIUM: 142 mmol/L (ref 135–145)
Sodium: 136 mmol/L (ref 135–145)
TCO2: 12 mmol/L — AB (ref 22–32)
TCO2: 13 mmol/L — ABNORMAL LOW (ref 22–32)

## 2017-07-25 LAB — I-STAT BETA HCG BLOOD, ED (MC, WL, AP ONLY): I-stat hCG, quantitative: <5 m[IU]/mL (ref <5)

## 2017-07-25 LAB — LIPASE, BLOOD: LIPASE: 25 U/L (ref 11–51)

## 2017-07-25 LAB — MAGNESIUM: Magnesium: 2.1 mg/dL (ref 1.7–2.4)

## 2017-07-25 LAB — BETA-HYDROXYBUTYRIC ACID: Beta-Hydroxybutyric Acid: >4.5 mmol/L — ABNORMAL HIGH (ref 0.05–0.27)

## 2017-07-25 MED ORDER — SODIUM CHLORIDE 0.9 % IV BOLUS (SEPSIS)
1000.0000 mL | Freq: Once | INTRAVENOUS | Status: AC
  Administered 2017-07-25: 1000 mL via INTRAVENOUS

## 2017-07-25 MED ORDER — ENOXAPARIN SODIUM 40 MG/0.4ML ~~LOC~~ SOLN
40.0000 mg | SUBCUTANEOUS | Status: DC
  Administered 2017-07-25 – 2017-07-26 (×2): 40 mg via SUBCUTANEOUS
  Filled 2017-07-25 (×2): qty 0.4

## 2017-07-25 MED ORDER — ACETAMINOPHEN 650 MG RE SUPP: 650.0000 mg | Freq: Four times a day (QID) | RECTAL | Status: DC | PRN

## 2017-07-25 MED ORDER — ONDANSETRON HCL 4 MG PO TABS: 4.0000 mg | ORAL_TABLET | Freq: Four times a day (QID) | ORAL | Status: DC | PRN

## 2017-07-25 MED ORDER — ONDANSETRON HCL 4 MG/2ML IJ SOLN
4.0000 mg | Freq: Four times a day (QID) | INTRAMUSCULAR | Status: DC | PRN
  Administered 2017-07-26: 4 mg via INTRAVENOUS
  Filled 2017-07-25: qty 2

## 2017-07-25 MED ORDER — POTASSIUM CHLORIDE 10 MEQ/100ML IV SOLN
10.0000 meq | INTRAVENOUS | Status: AC
  Administered 2017-07-25 (×2): 10 meq via INTRAVENOUS
  Filled 2017-07-25 (×2): qty 100

## 2017-07-25 MED ORDER — DEXTROSE-NACL 5-0.45 % IV SOLN
INTRAVENOUS | Status: DC
  Administered 2017-07-25 – 2017-07-26 (×2): via INTRAVENOUS

## 2017-07-25 MED ORDER — ONDANSETRON 4 MG PO TBDP
4.0000 mg | ORAL_TABLET | Freq: Once | ORAL | Status: AC | PRN
  Administered 2017-07-25: 4 mg via ORAL
  Filled 2017-07-25: qty 1

## 2017-07-25 MED ORDER — SODIUM CHLORIDE 0.9 % IV SOLN
INTRAVENOUS | Status: DC
  Administered 2017-07-26: 5.4 [IU]/h via INTRAVENOUS
  Filled 2017-07-25: qty 1

## 2017-07-25 MED ORDER — SODIUM CHLORIDE 0.9 % IV SOLN
INTRAVENOUS | Status: DC
  Administered 2017-07-25: 22:00:00 via INTRAVENOUS

## 2017-07-25 MED ORDER — ACETAMINOPHEN 325 MG PO TABS
650.0000 mg | ORAL_TABLET | Freq: Four times a day (QID) | ORAL | Status: DC | PRN
  Administered 2017-07-26 (×2): 650 mg via ORAL
  Filled 2017-07-25 (×2): qty 2

## 2017-07-25 MED ORDER — SODIUM CHLORIDE 0.9 % IV SOLN
INTRAVENOUS | Status: DC
  Administered 2017-07-25: 18:00:00 via INTRAVENOUS

## 2017-07-25 MED ORDER — SODIUM CHLORIDE 0.9 % IV SOLN
INTRAVENOUS | Status: DC
  Administered 2017-07-25: 5.4 [IU]/h via INTRAVENOUS
  Filled 2017-07-25: qty 1

## 2017-07-25 NOTE — ED Notes (Signed)
Patient transported to X-ray 

## 2017-07-25 NOTE — ED Triage Notes (Signed)
Pt arrived POV c/o vomiting for the last 24 hours.  Denies abdominal pain, SOB.

## 2017-07-25 NOTE — ED Notes (Signed)
EDP aware of pts glucose and lactic acid level

## 2017-07-25 NOTE — H&P (Signed)
History and Physical    Maple MirzaSherry Bayman YNW:[id number] DOB: 18-May-1966 DOA: 07/25/2017  PCP: Patient, No Pcp Per  Patient coming from:  home  Chief Complaint:  n/v  HPI: Maple MirzaSherry Mattes is a 51 y.o. female with medical history significant of DM off meds for over 2 years comes in with several days of nausea and vomiting and not feeling well.  No fevers.  No respiratory or urinary symptoms.  No blood in vomit.  No abdominal pain.  She stopped taking her meds 2 years ago and has not followed up with a doctor since.  No rashes.  She has been having a headache and been using a kerosene heater in her home.  Pt found to have sugar over 700 and gap of 24.  Referred for admission for DKA.  Review of Systems: As per HPI otherwise 10 point review of systems negative.   Past Medical History:  Diagnosis Date  . Diabetes mellitus without complication [facility](HCC)     Past Surgical History:  Procedure Laterality Date  . APPENDECTOMY    . TONSILLECTOMY       has no tobacco, alcohol, and drug history on file.  none  Allergies  Allergen Reactions  . Sulfa Antibiotics Rash    Rash around mouth    No family history on file.  No premature CAD  Prior to Admission medications   Medication Sig Start Date End Date Taking? Authorizing Provider  albuterol (PROVENTIL HFA;VENTOLIN HFA) 108 (90 Base) MCG/ACT inhaler Inhale every 6 (six) hours as needed into the lungs for wheezing or shortness of breath.   Yes [provider]  Aspirin-Salicylamide-Caffeine (BC HEADACHE POWDER PO) Take 1 packet 2 (two) times daily as needed by mouth (headache).   Yes [provider]    Physical Exam: Vitals:   07/25/17 1645 07/25/17 1715 07/25/17 1756 07/25/17 1800  BP: (!) 145/78 125/72  115/65  Pulse:  (!) 113    Resp:  19  (!) 23  Temp:   99.3 F (37.4 C)   TempSrc:   Rectal   SpO2:  98%    Weight:      Height:          Constitutional: NAD, calm, comfortable Vitals:   07/25/17 1645 07/25/17 1715  07/25/17 1756 07/25/17 1800  BP: (!) 145/78 125/72  115/65  Pulse:  (!) 113    Resp:  19  (!) 23  Temp:   99.3 F (37.4 C)   TempSrc:   Rectal   SpO2:  98%    Weight:      Height:       Eyes: PERRL, lids and conjunctivae normal ENMT: Mucous membranes are moist. Posterior pharynx clear of any exudate or lesions.Normal dentition.  Neck: normal, supple, no masses, no thyromegaly Respiratory: clear to auscultation bilaterally, no wheezing, no crackles. Normal respiratory effort. No accessory muscle use.  Cardiovascular: Regular rate and rhythm, no murmurs / rubs / gallops. No extremity edema. 2+ pedal pulses. No carotid bruits.  Abdomen: no tenderness, no masses palpated. No hepatosplenomegaly. Bowel sounds positive.  Musculoskeletal: no clubbing / cyanosis. No joint deformity upper and lower extremities. Good ROM, no contractures. Normal muscle tone.  Skin: no rashes, lesions, ulcers. No induration Neurologic: CN 2-12 grossly intact. Sensation intact, DTR normal. Strength 5/5 in all 4.  Psychiatric: Normal judgment and insight. Alert and oriented x 3. Normal mood.    Labs on Admission: I have personally reviewed following labs and imaging studies  CBC: Recent Labs  Lab 07/25/17 1533 07/25/17 1626 07/25/17 1816  WBC 14.8*  --   --   HGB 17.2* 17.7* 16.0*  HCT 51.5* 52.0* 47.0*  MCV 89.9  --   --   PLT 360  --   --    Basic Metabolic Panel: Recent Labs  Lab 07/25/17 1533 07/25/17 1626 07/25/17 1816  NA 135 136 142  K 5.1 5.2* 4.8  CL 99* 105 112*  CO2 12*  --   --   GLUCOSE 811* >700* 696*  BUN 39* 42* 43*  CREATININE 2.01* 1.10* 1.00  CALCIUM 9.1  --   --    GFR: Estimated Creatinine Clearance: 63.7 mL/min (by C-G formula based on SCr of 1 mg/dL). Liver Function Tests: Recent Labs  Lab 07/25/17 1533  AST 11*  ALT 16  ALKPHOS 134*  BILITOT 1.9*  PROT 8.4*  ALBUMIN 3.9   Recent Labs  Lab 07/25/17 1533  LIPASE 25   No results for input(s): AMMONIA in the  last 168 hours. Coagulation Profile: No results for input(s): INR, PROTIME in the last 168 hours. Cardiac Enzymes: No results for input(s): CKTOTAL, CKMB, CKMBINDEX, TROPONINI in the last 168 hours. BNP (last 3 results) No results for input(s): PROBNP in the last 8760 hours. HbA1C: No results for input(s): HGBA1C in the last 72 hours. CBG: Recent Labs  Lab 07/25/17 1530 07/25/17 1719 07/25/17 1820  GLUCAP >600* >600* 563*   Lipid Profile: No results for input(s): CHOL, HDL, LDLCALC, TRIG, CHOLHDL, LDLDIRECT in the last 72 hours. Thyroid Function Tests: No results for input(s): TSH, T4TOTAL, FREET4, T3FREE, THYROIDAB in the last 72 hours. Anemia Panel: No results for input(s): VITAMINB12, FOLATE, FERRITIN, TIBC, IRON, RETICCTPCT in the last 72 hours. Urine analysis: No results found for: COLORURINE, APPEARANCEUR, LABSPEC, PHURINE, GLUCOSEU, HGBUR, BILIRUBINUR, KETONESUR, PROTEINUR, UROBILINOGEN, NITRITE, LEUKOCYTESUR Sepsis Labs: !!!!!!!!!!!!!!!!!!!!!!!!!!!!!!!!!!!!!!!!!!!! @LABRCNTIP (procalcitonin:4,lacticidven:4) )No results found for this or any previous visit (from the past 240 hour(s)).   Radiological Exams on Admission: Dg Chest 2 View  Result Date: 07/25/2017 CLINICAL DATA:  Vomiting for 24 hours. EXAM: CHEST  2 VIEW COMPARISON:  None. FINDINGS: There is a torturous thoracic aorta. The heart, hila, and mediastinum are otherwise normal. No pneumothorax. No suspicious nodules, masses, or focal infiltrates. IMPRESSION: No active cardiopulmonary disease. Electronically Signed   By: Gerome Samavid  Williams III M.D   On: 07/25/2017 17:01    EKG: Independently reviewed. Sinus tachycaradia no acute issues Case discussed with edp cxr reviewed no edema orinfiltrate   Assessment/Plan 51 yo female with previous h/o dm comes in with DKA  Principal Problem:   DKA (diabetic ketoacidoses) (HCC)- insulin drip.  Hourly glucose cks.  q 4 hour bmps until anion gap closes.  Ivf.  Ck hga1c.    Npo x low carb fluids.  Carbon minoxide level pending.  ua pending. cxr neg.  Active Problems:   AKI (acute kidney injury) (HCC)- due to volume depletion and dehydration.  Should improve with ivf.  ua pending     DVT prophylaxis:  lovenox Code Status:  full Family Communication:  husband Disposition Plan:  Per day team Consults called:  none Admission status:  stepdown   Cardale Dorer A MD [facility]  If 7PM-7AM, please contact night-coverage [email or web address] Password [email or web address]  07/25/2017, 6:36 PM

## 2017-07-25 NOTE — ED Provider Notes (Signed)
[facility] EMERGENCY DEPARTMENT Provider Note   CSN: [id number] Arrival date & time: 07/25/17  1435     History   Chief Complaint Chief Complaint  Patient presents with  . Emesis    HPI Christy Cole is a 51 y.o. female.  HPI  Patient is a 51 year old female with a history of type 2 diabetes mellitus (not currently on antihyperglycemic meds) presenting for 48 hours of emesis.  Patient reports that this began when the power went out in the 2 evenings ago and she and her partner were heating the house with kerosene and candles.  Patient reports he had a global headache without visual changes yesterday, however this is resolved today.  Patient reports in the last 24 hours she has vomited approximately 30 times.  No fevers.  Patient reports she was profusely sweating last night.  Patient denies any focal abdominal tenderness or diarrhea.  Last bowel movement yesterday and normal for patient without melena or hematochezia.  Patient reports a productive cough of sputum without hemoptysis.  No chest pain or shortness of breath currently. Patient was previously followed by PCP in [facility] for her type 2 diabetes mellitus and on glipizide, however she has not had this in over 2 years.  Past Medical History:  Diagnosis Date  . Diabetes mellitus without complication [facility])     Patient Active Problem List   Diagnosis Date Noted  . DKA (diabetic ketoacidoses) (HCC) 07/25/2017    Past Surgical History:  Procedure Laterality Date  . APPENDECTOMY    . TONSILLECTOMY      OB History    No data available       Home Medications    Prior to Admission medications   Medication Sig Start Date End Date Taking? Authorizing Provider  albuterol (PROVENTIL HFA;VENTOLIN HFA) 108 (90 Base) MCG/ACT inhaler Inhale every 6 (six) hours as needed into the lungs for wheezing or shortness of breath.   Yes [provider]  Aspirin-Salicylamide-Caffeine (BC HEADACHE POWDER PO)  Take 1 packet 2 (two) times daily as needed by mouth (headache).   Yes [provider]    Family History No family history on file.  Social History Social History   Tobacco Use  . Smoking status: Not on file  Substance Use Topics  . Alcohol use: Not on file  . Drug use: Not on file     Allergies   Sulfa antibiotics   Review of Systems Review of Systems  Constitutional: Positive for chills. Negative for fever.  HENT: Negative for congestion and rhinorrhea.   Eyes: Negative for visual disturbance.  Respiratory: Positive for cough. Negative for shortness of breath.   Cardiovascular: Negative for chest pain.  Gastrointestinal: Positive for nausea and vomiting. Negative for abdominal pain, blood in stool, constipation and diarrhea.  Genitourinary: Negative for dysuria and flank pain.  Musculoskeletal: Negative for back pain.  Skin: Negative for rash.  Neurological: Positive for dizziness, light-headedness and headaches. Negative for syncope.     Physical Exam Updated Vital Signs BP 125/72   Pulse (!) 113   Temp 99.3 F (37.4 C) (Rectal)   Resp 19   Ht 5\' 4"  (1.626 m)   Wt 73 kg (161 lb)   SpO2 98%   BMI 27.64 kg/m   Physical Exam  Constitutional: She appears well-developed. She appears distressed.  Ill-appearing and uncomfortable lying on side in the bed.  HENT:  Head: Normocephalic and atraumatic.  Mouth/Throat: Oropharynx is clear and moist.  Eyes: Conjunctivae  and EOM are normal. Pupils are equal, round, and reactive to light.  Neck: Normal range of motion. Neck supple.  Cardiovascular: Normal rate, regular rhythm, S1 normal and S2 normal.  No murmur heard. Pulmonary/Chest: Effort normal and breath sounds normal. She has no wheezes. She has no rales.  Abdominal: Soft. She exhibits no distension. There is tenderness. There is no guarding.  Diffuse discomfort to the abdomen without focal tenderness.  Musculoskeletal: Normal range of motion. She  exhibits no edema or deformity.  Lymphadenopathy:    She has no cervical adenopathy.  Neurological: She is alert.  Cranial nerves grossly intact. Patient was extremities symmetrically and with good coordination.  Skin: Skin is warm and dry. No rash noted. No erythema.  Psychiatric: She has a normal mood and affect. Her behavior is normal. Judgment and thought content normal.  Nursing note and vitals reviewed.    ED Treatments / Results  Labs (all labs ordered are listed, but only abnormal results are displayed) Labs Reviewed  COMPREHENSIVE METABOLIC PANEL - Abnormal; Notable for the following components:      Result Value   Chloride 99 (*)    CO2 12 (*)    Glucose, Bld 811 (*)    BUN 39 (*)    Creatinine, Ser 2.01 (*)    Total Protein 8.4 (*)    AST 11 (*)    Alkaline Phosphatase 134 (*)    Total Bilirubin 1.9 (*)    GFR calc non Af Amer 27 (*)    GFR calc Af Amer 31 (*)    Anion gap 24 (*)    All other components within normal limits  CBC - Abnormal; Notable for the following components:   WBC 14.8 (*)    RBC 5.73 (*)    Hemoglobin 17.2 (*)    HCT 51.5 (*)    All other components within normal limits  BETA-HYDROXYBUTYRIC ACID - Abnormal; Notable for the following components:   Beta-Hydroxybutyric Acid >4.50 (*)    All other components within normal limits  CBG MONITORING, ED - Abnormal; Notable for the following components:   Glucose-Capillary >600 (*)    All other components within normal limits  I-STAT VENOUS BLOOD GAS, ED - Abnormal; Notable for the following components:   pH, Ven 7.223 (*)    pCO2, Ven 28.1 (*)    Bicarbonate 11.6 (*)    TCO2 12 (*)    Acid-base deficit 14.0 (*)    All other components within normal limits  I-STAT CG4 LACTIC ACID, ED - Abnormal; Notable for the following components:   Lactic Acid, Venous 2.17 (*)    All other components within normal limits  I-STAT CHEM 8, ED - Abnormal; Notable for the following components:   Potassium 5.2  (*)    BUN 42 (*)    Creatinine, Ser 1.10 (*)    Glucose, Bld >700 (*)    Calcium, Ion 1.11 (*)    TCO2 12 (*)    Hemoglobin 17.7 (*)    HCT 52.0 (*)    All other components within normal limits  CBG MONITORING, ED - Abnormal; Notable for the following components:   Glucose-Capillary >600 (*)    All other components within normal limits  LIPASE, BLOOD  URINALYSIS, ROUTINE W REFLEX MICROSCOPIC  CARBON MONOXIDE, BLOOD (PERFORMED AT REF LAB)  I-STAT TROPONIN, ED  I-STAT BETA HCG BLOOD, ED ([facility], [facility], [facility] ONLY)  I-STAT CG4 LACTIC ACID, ED  I-STAT CHEM 8, ED    EKG  EKG Interpretation  Date/Time:  Saturday July 25 2017 16:18:12 EST Ventricular Rate:  109 PR Interval:    QRS Duration: 87 QT Interval:  348 QTC Calculation: 469 R Axis:   79 Text Interpretation:  Sinus tachycardia Sinus tachycardia Confirmed by Bary CastillaMackuen, Courteney ([id number]) on 07/25/2017 5:41:20 PM       Radiology Dg Chest 2 View  Result Date: 07/25/2017 CLINICAL DATA:  Vomiting for 24 hours. EXAM: CHEST  2 VIEW COMPARISON:  None. FINDINGS: There is a torturous thoracic aorta. The heart, hila, and mediastinum are otherwise normal. No pneumothorax. No suspicious nodules, masses, or focal infiltrates. IMPRESSION: No active cardiopulmonary disease. Electronically Signed   By: Gerome Samavid  Williams III M.D   On: 07/25/2017 17:01    Procedures Procedures (including critical care time)  Medications Ordered in ED Medications  insulin regular (NOVOLIN R,HUMULIN R) 100 Units in sodium chloride 0.9 % 100 mL (1 Units/mL) infusion (5.4 Units/hr Intravenous New Bag/Given 07/25/17 1717)  sodium chloride 0.9 % bolus 1,000 mL (0 mLs Intravenous Stopped 07/25/17 1757)    And  0.9 %  sodium chloride infusion ( Intravenous New Bag/Given 07/25/17 1759)  ondansetron (ZOFRAN-ODT) disintegrating tablet 4 mg (4 mg Oral Given 07/25/17 1537)     Initial Impression / Assessment and Plan / ED Course  I have reviewed the triage vital signs  and the nursing notes.  Pertinent labs & imaging results that were available during my care of the patient were reviewed by me and considered in my medical decision making (see chart for details).     Final Clinical Impressions(s) / ED Diagnoses   Final diagnoses:  Diabetic ketoacidosis without coma associated with type 2 diabetes mellitus (HCC)   Patient is ill-appearing and tachycardic, but normotensive and afebrile.  Differential diagnosis includes DKA, HHS, carbon monoxide poisoning.   Glucose 811.  Anion gap 24.  Venous blood gas demonstrates pH of 7.223.  No evidence of pneumonia on chest x-ray.  Urinalysis not back yet.  Carbon monoxide in process.  Initiated fluid bolus, maintenance fluid, and insulin drip.  6:14 PM Spoke with Dr. Onalee Huaavid in hospital medicine, patient will be admitted for DKA.  Repeat i-STAT Chem-8 ordered  to continue to monitor electrolytes.  This is a shared visit with Dr. Bary Castillaourteney MacKuen. Patient was independently evaluated by this attending physician. Attending physician consulted in evaluation and admission management.    ED Discharge Orders    None       Delia ChimesMurray, Alyssa B, PA-C 07/25/17 1904    Abelino DerrickMackuen, Courteney Lyn, MD 07/26/17 [phone]

## 2017-07-26 DIAGNOSIS — E1110 Type 2 diabetes mellitus with ketoacidosis without coma: Secondary | ICD-10-CM

## 2017-07-26 LAB — GLUCOSE, CAPILLARY
Glucose-Capillary: 233 mg/dL — ABNORMAL HIGH (ref 65–99)
Glucose-Capillary: 284 mg/dL — ABNORMAL HIGH (ref 65–99)
Glucose-Capillary: 265 mg/dL — ABNORMAL HIGH (ref 65–99)

## 2017-07-26 LAB — CBG MONITORING, ED
GLUCOSE-CAPILLARY: 126 mg/dL — AB (ref 65–99)
GLUCOSE-CAPILLARY: 142 mg/dL — AB (ref 65–99)
GLUCOSE-CAPILLARY: 153 mg/dL — AB (ref 65–99)
GLUCOSE-CAPILLARY: 161 mg/dL — AB (ref 65–99)
GLUCOSE-CAPILLARY: 162 mg/dL — AB (ref 65–99)
GLUCOSE-CAPILLARY: 197 mg/dL — AB (ref 65–99)
Glucose-Capillary: 149 mg/dL — ABNORMAL HIGH (ref 65–99)
Glucose-Capillary: 142 mg/dL — ABNORMAL HIGH (ref 65–99)
Glucose-Capillary: 172 mg/dL — ABNORMAL HIGH (ref 65–99)
Glucose-Capillary: 189 mg/dL — ABNORMAL HIGH (ref 65–99)
Glucose-Capillary: 229 mg/dL — ABNORMAL HIGH (ref 65–99)

## 2017-07-26 LAB — BASIC METABOLIC PANEL
Anion gap: 5 (ref 5–15)
Anion gap: 7 (ref 5–15)
BUN: 44 mg/dL — ABNORMAL HIGH (ref 6–20)
BUN: 31 mg/dL — AB (ref 6–20)
CHLORIDE: 112 mmol/L — AB (ref 101–111)
CHLORIDE: 110 mmol/L (ref 101–111)
CO2: 21 mmol/L — AB (ref 22–32)
CO2: 20 mmol/L — AB (ref 22–32)
CREATININE: 0.81 mg/dL (ref 0.44–1.00)
Calcium: 8.5 mg/dL — ABNORMAL LOW (ref 8.9–10.3)
Calcium: 8.5 mg/dL — ABNORMAL LOW (ref 8.9–10.3)
Creatinine, Ser: 1.07 mg/dL — ABNORMAL HIGH (ref 0.44–1.00)
GFR calc Af Amer: >60 mL/min (ref >60)
GFR calc non Af Amer: 58 mL/min — ABNORMAL LOW (ref >60)
GFR calc non Af Amer: >60 mL/min (ref >60)
GLUCOSE: 173 mg/dL — AB (ref 65–99)
Glucose, Bld: 177 mg/dL — ABNORMAL HIGH (ref 65–99)
POTASSIUM: 3.9 mmol/L (ref 3.5–5.1)
Potassium: 3.4 mmol/L — ABNORMAL LOW (ref 3.5–5.1)
SODIUM: 138 mmol/L (ref 135–145)
SODIUM: 137 mmol/L (ref 135–145)

## 2017-07-26 LAB — HIV ANTIBODY (ROUTINE TESTING W REFLEX): HIV Screen 4th Generation wRfx: NONREACTIVE

## 2017-07-26 MED ORDER — INSULIN ASPART 100 UNIT/ML ~~LOC~~ SOLN
0.0000 [IU] | Freq: Every day | SUBCUTANEOUS | Status: DC
  Administered 2017-07-26: 3 [IU] via SUBCUTANEOUS

## 2017-07-26 MED ORDER — INSULIN ASPART 100 UNIT/ML ~~LOC~~ SOLN
0.0000 [IU] | Freq: Three times a day (TID) | SUBCUTANEOUS | Status: DC
  Administered 2017-07-26: 5 [IU] via SUBCUTANEOUS

## 2017-07-26 MED ORDER — POTASSIUM CHLORIDE CRYS ER 20 MEQ PO TBCR
40.0000 meq | EXTENDED_RELEASE_TABLET | Freq: Once | ORAL | Status: AC
  Administered 2017-07-26: 40 meq via ORAL
  Filled 2017-07-26: qty 2

## 2017-07-26 MED ORDER — INSULIN GLARGINE 100 UNIT/ML ~~LOC~~ SOLN
10.0000 [IU] | Freq: Every day | SUBCUTANEOUS | Status: DC
Start: 2017-07-26 — End: 2017-07-27
  Administered 2017-07-26: 10 [IU] via SUBCUTANEOUS
  Filled 2017-07-26 (×2): qty 0.1

## 2017-07-26 MED ORDER — PROMETHAZINE HCL 25 MG/ML IJ SOLN
12.5000 mg | Freq: Four times a day (QID) | INTRAMUSCULAR | Status: DC | PRN
  Filled 2017-07-26: qty 1

## 2017-07-26 NOTE — ED Notes (Signed)
  CBG 142  

## 2017-07-26 NOTE — Progress Notes (Signed)
Pt was throwing up. Pt was given PRN zofran at 3pm with no relief. Made on call aware. PRN phenergan ordered and given. Will continue to monitor pt.

## 2017-07-26 NOTE — Progress Notes (Signed)
PROGRESS NOTE    Christy MirzaSherry Nickerson  BJY:[id number] DOB: 05-05-1966 DOA: 07/25/2017 PCP: Patient, No Pcp Per     Brief Narrative:  Christy Cole is a 51 y.o. female with medical history significant of DM off meds for over 2 years comes in with several days of nausea and vomiting and not feeling well. She stopped taking her meds 2 years ago and has not followed up with a doctor since. She has been having a headache and been using a kerosene heater in her home.  Pt found to have sugar over 700 and gap of 24.  Referred for admission for DKA.  Assessment & Plan:   Principal Problem:   DKA (diabetic ketoacidoses) (HCC) Active Problems:   AKI (acute kidney injury) (HCC)   DKA -Ha1c 12.9 -Anion gap now closed, off insulin gtt. Continue lantus, novolog sliding scale -DM coordinator consult   AKI -Resolved with IVF   Hypokalemia -Replace, trend   ?CO poisoning -Continue [place] O2 -Check blood carbon monoxide level, pending   Cocaine abuse -UDS positive for cocaine  -SW consult    DVT prophylaxis: lovenox Code Status: full Family Communication: husband over the phone Disposition Plan: pending improvement   Consultants:   None  Procedures:   None   Antimicrobials:  Anti-infectives (From admission, onward)   None       Subjective: Patient presented with chief complaint of overall malaise, nausea and vomiting.  Patient is seen in the emergency department, no new complaints.  Overall not feeling well.  Unable to verbalize any specific complaints.  Denies any chest pain or shortness of breath.  Objective: Vitals:   07/26/17 1100 07/26/17 1200 07/26/17 1300 07/26/17 1330  BP: 130/66 138/78 136/69 136/75  Pulse: 90 100 97 (!) 101  Resp: 20 16 19 19   Temp:      TempSrc:      SpO2: 98% 96% 100% 99%  Weight:      Height:        Intake/Output Summary (Last 24 hours) at 07/26/2017 1357 Last data filed at 07/26/2017 1316 Gross per 24 hour  Intake 2149.86 ml  Output -  Net  2149.86 ml   Filed Weights   07/25/17 1528  Weight: 73 kg (161 lb)    Examination:  General exam: Appears calm  Respiratory system: Clear to auscultation. Respiratory effort normal. Cardiovascular system: S1 & S2 heard, RRR. No JVD, murmurs, rubs, gallops or clicks. No pedal edema. Gastrointestinal system: Abdomen is nondistended, soft and nontender. No organomegaly or masses felt. Normal bowel sounds heard. Central nervous system: Alert. No focal neurological deficits. Extremities: Symmetric 5 x 5 power. Skin: No rashes, lesions or ulcers Psychiatry: Judgement and insight appear normal. Mood & affect appropriate.   Data Reviewed: I have personally reviewed following labs and imaging studies  CBC: Recent Labs  Lab 07/25/17 1533 07/25/17 1626 07/25/17 1816 07/25/17 2234  WBC 14.8*  --   --  16.6*  HGB 17.2* 17.7* 16.0* 15.6*  HCT 51.5* 52.0* 47.0* 45.0  MCV 89.9  --   --  86.9  PLT 360  --   --  314   Basic Metabolic Panel: Recent Labs  Lab 07/25/17 1533 07/25/17 1626 07/25/17 1816 07/25/17 2234 07/26/17 0205 07/26/17 0833  NA 135 136 142 141 138 137  K 5.1 5.2* 4.8 4.1 3.9 3.4*  CL 99* 105 112* 112* 112* 110  CO2 12*  --   --  14* 21* 20*  GLUCOSE 811* >700* 696* 319* 177*  173*  BUN 39* 42* 43* 45* 44* 31*  CREATININE 2.01* 1.10* 1.00 1.46* 1.07* 0.81  CALCIUM 9.1  --   --  8.8* 8.5* 8.5*  MG  --   --   --  2.1  --   --    GFR: Estimated Creatinine Clearance: 78.6 mL/min (by C-G formula based on SCr of 0.81 mg/dL). Liver Function Tests: Recent Labs  Lab 07/25/17 1533  AST 11*  ALT 16  ALKPHOS 134*  BILITOT 1.9*  PROT 8.4*  ALBUMIN 3.9   Recent Labs  Lab 07/25/17 1533  LIPASE 25   No results for input(s): AMMONIA in the last 168 hours. Coagulation Profile: No results for input(s): INR, PROTIME in the last 168 hours. Cardiac Enzymes: No results for input(s): CKTOTAL, CKMB, CKMBINDEX, TROPONINI in the last 168 hours. BNP (last 3 results) No  results for input(s): PROBNP in the last 8760 hours. HbA1C: Recent Labs    07/25/17 2234  HGBA1C 12.9*   CBG: Recent Labs  Lab 07/26/17 0727 07/26/17 0848 07/26/17 1031 07/26/17 1133 07/26/17 1241  GLUCAP 142* 161* 172* 189* 229*   Lipid Profile: No results for input(s): CHOL, HDL, LDLCALC, TRIG, CHOLHDL, LDLDIRECT in the last 72 hours. Thyroid Function Tests: No results for input(s): TSH, T4TOTAL, FREET4, T3FREE, THYROIDAB in the last 72 hours. Anemia Panel: No results for input(s): VITAMINB12, FOLATE, FERRITIN, TIBC, IRON, RETICCTPCT in the last 72 hours. Sepsis Labs: Recent Labs  Lab 07/25/17 1627 07/25/17 1816  LATICACIDVEN 2.17* 1.78    No results found for this or any previous visit (from the past 240 hour(s)).     Radiology Studies: Dg Chest 2 View  Result Date: 07/25/2017 CLINICAL DATA:  Vomiting for 24 hours. EXAM: CHEST  2 VIEW COMPARISON:  None. FINDINGS: There is a torturous thoracic aorta. The heart, hila, and mediastinum are otherwise normal. No pneumothorax. No suspicious nodules, masses, or focal infiltrates. IMPRESSION: No active cardiopulmonary disease. Electronically Signed   By: Gerome Samavid  Williams III M.D   On: 07/25/2017 17:01      Scheduled Meds: . enoxaparin (LOVENOX) injection  40 mg Subcutaneous Q24H  . insulin aspart  0-5 Units Subcutaneous QHS  . insulin aspart  0-9 Units Subcutaneous TID WC  . insulin glargine  10 Units Subcutaneous Daily   Continuous Infusions:   LOS: 1 day    Time spent: 40 minutes   Noralee StainJennifer Carless Slatten, DO [facility] [email or web address] Password [email or web address] 07/26/2017, 1:57 PM

## 2017-07-26 NOTE — ED Notes (Signed)
CBG 161 

## 2017-07-27 DIAGNOSIS — E1110 Type 2 diabetes mellitus with ketoacidosis without coma: Secondary | ICD-10-CM | POA: Diagnosis not present

## 2017-07-27 LAB — CBC
HCT: 43.1 % (ref 36.0–46.0)
Hemoglobin: 14.2 g/dL (ref 12.0–15.0)
MCH: 29.2 pg (ref 26.0–34.0)
MCHC: 32.9 g/dL (ref 30.0–36.0)
MCV: 88.7 fL (ref 78.0–100.0)
PLATELETS: 283 10*3/uL (ref 150–400)
RBC: 4.86 MIL/uL (ref 3.87–5.11)
RDW: 13.2 % (ref 11.5–15.5)
WBC: 9.7 10*3/uL (ref 4.0–10.5)

## 2017-07-27 LAB — GLUCOSE, CAPILLARY
GLUCOSE-CAPILLARY: 302 mg/dL — AB (ref 65–99)
GLUCOSE-CAPILLARY: 230 mg/dL — AB (ref 65–99)

## 2017-07-27 LAB — BASIC METABOLIC PANEL
ANION GAP: 13 (ref 5–15)
BUN: 14 mg/dL (ref 6–20)
CO2: 18 mmol/L — ABNORMAL LOW (ref 22–32)
Calcium: 8.6 mg/dL — ABNORMAL LOW (ref 8.9–10.3)
Chloride: 106 mmol/L (ref 101–111)
Creatinine, Ser: 0.82 mg/dL (ref 0.44–1.00)
GFR calc non Af Amer: >60 mL/min (ref >60)
GLUCOSE: 296 mg/dL — AB (ref 65–99)
POTASSIUM: 4.2 mmol/L (ref 3.5–5.1)
SODIUM: 137 mmol/L (ref 135–145)

## 2017-07-27 LAB — CARBON MONOXIDE, BLOOD (PERFORMED AT REF LAB): Carbon Monoxide, Blood: 3 % (ref 0.0–3.6)

## 2017-07-27 LAB — MAGNESIUM: MAGNESIUM: 2 mg/dL (ref 1.7–2.4)

## 2017-07-27 MED ORDER — INSULIN GLARGINE 100 UNIT/ML ~~LOC~~ SOLN
20.0000 [IU] | Freq: Every day | SUBCUTANEOUS | Status: DC
  Administered 2017-07-27: 20 [IU] via SUBCUTANEOUS
  Filled 2017-07-27: qty 0.2

## 2017-07-27 MED ORDER — INSULIN LISPRO 100 UNIT/ML ~~LOC~~ SOLN: SUBCUTANEOUS | 0 refills | Status: AC

## 2017-07-27 MED ORDER — INSULIN DETEMIR 100 UNIT/ML ~~LOC~~ SOLN: 25.0000 [IU] | Freq: Every day | SUBCUTANEOUS | 0 refills | Status: AC

## 2017-07-27 MED ORDER — BLOOD GLUCOSE METER KIT: PACK | 0 refills | Status: AC

## 2017-07-27 MED ORDER — LIVING WELL WITH DIABETES BOOK
Freq: Once | Status: AC
  Administered 2017-07-27: 13:00:00
  Filled 2017-07-27: qty 1

## 2017-07-27 MED ORDER — "INSULIN SYRINGE 31G X 5/16"" 0.3 ML MISC": 0 refills | Status: AC

## 2017-07-27 MED ORDER — INSULIN ASPART 100 UNIT/ML ~~LOC~~ SOLN
0.0000 [IU] | Freq: Three times a day (TID) | SUBCUTANEOUS | Status: DC
  Administered 2017-07-27: 11 [IU] via SUBCUTANEOUS
  Administered 2017-07-27: 5 [IU] via SUBCUTANEOUS

## 2017-07-27 NOTE — Progress Notes (Addendum)
MD- Please keep cost considerations in mind when prescribing insulin at time of d/c. Patient concerned about the costs of co-pays. Insulin vials and syringes are usually the less expensive route of administration.  Patient also needs CBG meter and supplies at time of d/c- Order # [id number]   Met with patient this AM to discuss events leading to admission.  Discussed with patient diagnosis of DKA (pathophysiology), treatment of DKA, lab results, and transition plan to SQ insulin regimen.   Patient told me she had gestational diabetes many years ago with her pregnancies and took insulin during her pregnancies.  Was diagnosed with diabetes a few years ago and was taking Glipizide and Metformin.  Also stated to me that she took Lantus insulin (in pen form) but could not remember any of the dosages of any of these medications.  Stopped all he DM meds about 2 years ago and stated she did this b/c she "felt like she knew more than the doctor did".  Stated she felt fine up until about a few weeks ago.  Endorsed polyuria, polydipsia.    Spoke with pt about diabetes.  Discussed A1C results with her and explained what an A1C is, basic pathophysiology of DM Type 2, basic home care, basic diabetes diet nutrition principles, importance of checking CBGs and maintaining good CBG control to prevent long-term and short-term complications.  Reviewed signs and symptoms of hyperglycemia and hypoglycemia and how to treat hypoglycemia and hyperglycemia at home.  Also reviewed blood sugar goals and A1c goals for home.    RNs to provide ongoing basic DM education at bedside with this patient.  Have ordered educational booklet, insulin starter kit, and DM videos.  Have also placed RD consult for DM diet education for this patient.  Verbally reviewed how to use an insulin pen with patient.  Patient stated she remembers all the steps and was able to verbally recall the steps of using an insulin pen at home with me.  Also  discussed DM diet information with patient.  Encouraged patient to avoid beverages with sugar (regular soda, sweet tea, lemonade, fruit juice) and to consume mostly water.  Discussed what foods contain carbohydrates and how carbohydrates affect the body's blood sugar levels.  Encouraged patient to be careful with her portion sizes (especially grains, starchy vegetables, and fruits).    Have asked Care management to check pt's insurance benefits to see if insulin pens will be covered by her insurance.  Patient very concerned about the cost of insulin for home.        --Will follow patient during hospitalization--  Wyn Quaker RN, MSN, CDE Diabetes Coordinator Inpatient Glycemic Control Team Team Pager: [phone] (8a-5p)

## 2017-07-27 NOTE — Care Management Note (Addendum)
Case Management Note  Patient Details  Name: Maple MirzaSherry Amesquita MRN: [id number] Date of Birth: 03-25-1966  Subjective/Objective:                    Action/Plan: Discussed insulin with MD   Cons[facility]. Appointment for Wednesday August 05, 2017 at 2 pm. Information given to patient and contact information given.   Confirmed patient does have [employer] insurance and prescription coverage. Explained if patient wanted to go else where for PCP, she can call [employer] and be provided a full list of providers in network with her insurance. If patient knows of a MD she would like to see she can call that MD office directly to see if that MD is in network and accepting new patients.   Patient voiced understanding top all of above.  Expected Discharge Date:  07/28/17               Expected Discharge Plan:  Home/Self Care  In-House Referral:     Discharge planning Services  CM Consult, Follow-up appt scheduled  Post Acute Care Choice:    Choice offered to:  Patient, Spouse  DME Arranged:    DME Agency:     HH Arranged:    HH Agency:     Status of Service:  Completed, signed off  If discussed at Long Length of Stay Meetings, dates discussed:    Additional Comments:  Kingsley PlanWile, Virginio Isidore Marie, RN 07/27/2017, 10:42 AM

## 2017-07-27 NOTE — Plan of Care (Signed)
  RD consulted for nutrition education regarding diabetes.   Lab Results  Component Value Date   HGBA1C 12.9 (H) 07/25/2017   Spoke with RN, who confirmed pt for discharge home today.   Spoke with pt and significant other at bedside. Pt shares that she "totally fell off the wagon", but is now motivated to take care of herself in regards to DM. She was able to recollect DM coordinator visit earlier this morning and teach back basic DM diet principles (plate method) to this RD. Pt acknowledges need to change her eating habits. Typical intake is 3 meals per day- Breakfast: bacon and eggs, Lunch: hot dog or hamburger with french fries, Dinner: pizza with salad. Beverage is either diet pepsi or unsweetened tea with sweet and low.   Pt reports that she attended DM education [facility] [place] when she was first diagnosed and found these classes helpful.   RD provided "Carbohydrate Counting for People with Diabetes" handout from the Academy of Nutrition and Dietetics. Discussed different food groups and their effects on blood sugar, emphasizing carbohydrate-containing foods. Provided list of carbohydrates and recommended serving sizes of common foods.  Discussed importance of controlled and consistent carbohydrate intake throughout the day. Provided examples of ways to balance meals/snacks and encouraged intake of high-fiber, whole grain complex carbohydrates. Teach back method used.  Expect fair compliance.  Body mass index is 29.44 kg/m. Pt meets criteria for overweight based on current BMI.  Current diet order is Carb Modified, patient is consuming approximately 100% of meals at this time. Labs and medications reviewed. No further nutrition interventions warranted at this time. RD contact information provided. If additional nutrition issues arise, please re-consult RD.  Hersel Mcmeen A. Mayford KnifeWilliams, RD, LDN, CDE Pager: [phone] After hours Pager: [phone]

## 2017-07-27 NOTE — Progress Notes (Signed)
Inpatient Diabetes Program Recommendations  AACE/ADA: New Consensus Statement on Inpatient Glycemic Control (2015)  Target Ranges:  Prepandial:   less than 140 mg/dL      Peak postprandial:   less than 180 mg/dL (1-2 hours)      Critically ill patients:  140 - 180 mg/dL   Results for Maple MirzaSAMS, Kariana (MRN [id number]) as of 07/27/2017 09:52  Ref. Range 07/25/2017 15:33  Sodium Latest Ref Range: 135 - 145 mmol/L 135  Potassium Latest Ref Range: 3.5 - 5.1 mmol/L 5.1  Chloride Latest Ref Range: 101 - 111 mmol/L 99 (L)  CO2 Latest Ref Range: 22 - 32 mmol/L 12 (L)  Glucose Latest Ref Range: 65 - 99 mg/dL 621811 (HH)  BUN Latest Ref Range: 6 - 20 mg/dL 39 (H)  Creatinine Latest Ref Range: 0.44 - 1.00 mg/dL 3.082.01 (H)  Calcium Latest Ref Range: 8.9 - 10.3 mg/dL 9.1  Anion gap Latest Ref Range: 5 - 15  24 (H)   Results for Maple MirzaSAMS, Tala (MRN [id number]) as of 07/27/2017 09:52  Ref. Range 07/25/2017 22:34  Hemoglobin A1C Latest Ref Range: 4.8 - 5.6 % 12.9 (H)     Admit with: DKA/ Stopped taking Diabetes Medications over 2 years ago  History: DM  Home DM Meds: None  Current Insulin Orders: Lantus 20 units daily      Novolog Moderate Correction Scale/ SSI (0-15 units) TID AC + HS      MD- Note patient transitioned to SQ Insulin yesterday.    Lantus dose increased this AM to 20 units daily.  Plan to speak with patient today.     --Will follow patient during hospitalization--  Ambrose FinlandJeannine Johnston Demerius Podolak RN, MSN, CDE Diabetes Coordinator Inpatient Glycemic Control Team Team Pager: [phone] (8a-5p)

## 2017-07-27 NOTE — Discharge Instructions (Signed)
Sugar goals for home: Before meals 80-130 mg/dl 2-Hours after meals less than 180 mg/dl   Check your blood sugar 4 times daily: before breakfast, before lunch, before dinner, before sleep. You may also check throughout the day if you feel that your blood sugar is too low or too high.   Levemir is your long-acting insulin. This should be used at the same time each day. Start with 25 units daily.   Humalog is your short-acting correction insulin. Check your blood sugar prior to your meal. Then depending on your blood sugar, inject insulin as follows: CBG 121 - 150: 2 units  CBG 151 - 200: 3 units  CBG 201 - 250: 5 units  CBG 251 - 300: 8 units  CBG 301 - 350: 11 units  CBG 351 - 400: 15 units   Keep a journal of your blood sugar results each day. Bring to your primary care physician.      Blood Glucose Monitoring, Adult Monitoring your blood sugar (glucose) helps you manage your diabetes. It also helps you and your health care provider determine how well your diabetes management plan is working. Blood glucose monitoring involves checking your blood glucose as often as directed, and keeping a record (log) of your results over time. Why should I monitor my blood glucose? Checking your blood glucose regularly can:  Help you understand how food, exercise, illnesses, and medicines affect your blood glucose.  Let you know what your blood glucose is at any time. You can quickly tell if you are having low blood glucose (hypoglycemia) or high blood glucose (hyperglycemia).  Help you and your health care provider adjust your medicines as needed.  When should I check my blood glucose? Follow instructions from your health care provider about how often to check your blood glucose. This may depend on:  The type of diabetes you have.  How well-controlled your diabetes is.  Medicines you are taking.  If you have type 1 diabetes:  Check your blood glucose at least 2 times a day.  Also  check your blood glucose: ? Before every insulin injection. ? Before and after exercise. ? Between meals. ? 2 hours after a meal. ? Occasionally between 2:00 a.m. and 3:00 a.m., as directed. ? Before potentially dangerous tasks, like driving or using heavy machinery. ? At bedtime.  You may need to check your blood glucose more often, up to 6-10 times a day: ? If you use an insulin pump. ? If you need multiple daily injections (MDI). ? If your diabetes is not well-controlled. ? If you are ill. ? If you have a history of severe hypoglycemia. ? If you have a history of not knowing when your blood glucose is getting low (hypoglycemia unawareness). If you have type 2 diabetes:  If you take insulin or other diabetes medicines, check your blood glucose at least 2 times a day.  If you are on intensive insulin therapy, check your blood glucose at least 4 times a day. Occasionally, you may also need to check between 2:00 a.m. and 3:00 a.m., as directed.  Also check your blood glucose: ? Before and after exercise. ? Before potentially dangerous tasks, like driving or using heavy machinery.  You may need to check your blood glucose more often if: ? Your medicine is being adjusted. ? Your diabetes is not well-controlled. ? You are ill. What is a blood glucose log?  A blood glucose log is a record of your blood glucose readings. It helps  you and your health care provider: ? Look for patterns in your blood glucose over time. ? Adjust your diabetes management plan as needed.  Every time you check your blood glucose, write down your result and notes about things that may be affecting your blood glucose, such as your diet and exercise for the day.  Most glucose meters store a record of glucose readings in the meter. Some meters allow you to download your records to a computer. How do I check my blood glucose? Follow these steps to get accurate readings of your blood glucose: Supplies  needed   Blood glucose meter.  Test strips for your meter. Each meter has its own strips. You must use the strips that come with your meter.  A needle to prick your finger (lancet). Do not use lancets more than once.  A device that holds the lancet (lancing device).  A journal or log book to write down your results. Procedure  Wash your hands with soap and water.  Prick the side of your finger (not the tip) with the lancet. Use a different finger each time.  Gently rub the finger until a small drop of blood appears.  Follow instructions that come with your meter for inserting the test strip, applying blood to the strip, and using your blood glucose meter.  Write down your result and any notes. Alternative testing sites  Some meters allow you to use areas of your body other than your finger (alternative sites) to test your blood.  If you think you may have hypoglycemia, or if you have hypoglycemia unawareness, do not use alternative sites. Use your finger instead.  Alternative sites may not be as accurate as the fingers, because blood flow is slower in these areas. This means that the result you get may be delayed, and it may be different from the result that you would get from your finger.  The most common alternative sites are: ? Forearm. ? Thigh. ? Palm of the hand. Additional tips  Always keep your supplies with you.  If you have questions or need help, all blood glucose meters have a 24-hour hotline number that you can call. You may also contact your health care provider.  After you use a few boxes of test strips, adjust (calibrate) your blood glucose meter by following instructions that came with your meter. This information is not intended to replace advice given to you by your health care provider. Make sure you discuss any questions you have with your health care provider. Document Released: 08/28/2003 Document Revised: 03/14/2016 Document Reviewed:  02/04/2016 Elsevier Interactive Patient Education  2017 [employer]    Diabetes Mellitus and Food It is important for you to manage your blood sugar (glucose) level. Your blood glucose level can be greatly affected by what you eat. Eating healthier foods in the appropriate amounts throughout the day at about the same time each day will help you control your blood glucose level. It can also help slow or prevent worsening of your diabetes mellitus. Healthy eating may even help you improve the level of your blood pressure and reach or maintain a healthy weight. General recommendations for healthful eating and cooking habits include:  Eating meals and snacks regularly. Avoid going long periods of time without eating to lose weight.  Eating a diet that consists mainly of plant-based foods, such as fruits, vegetables, nuts, legumes, and whole grains.  Using low-heat cooking methods, such as baking, instead of high-heat cooking methods, such as deep  frying.  Work with your dietitian to make sure you understand how to use the Nutrition Facts information on food labels. How can food affect me? Carbohydrates Carbohydrates affect your blood glucose level more than any other type of food. Your dietitian will help you determine how many carbohydrates to eat at each meal and teach you how to count carbohydrates. Counting carbohydrates is important to keep your blood glucose at a healthy level, especially if you are using insulin or taking certain medicines for diabetes mellitus. Alcohol Alcohol can cause sudden decreases in blood glucose (hypoglycemia), especially if you use insulin or take certain medicines for diabetes mellitus. Hypoglycemia can be a life-threatening condition. Symptoms of hypoglycemia (sleepiness, dizziness, and disorientation) are similar to symptoms of having too much alcohol. If your health care provider has given you approval to drink alcohol, do so in moderation and use the  following guidelines:  Women should not have more than one drink per day, and men should not have more than two drinks per day. One drink is equal to: ? 12 oz of beer. ? 5 oz of wine. ? 1 oz of hard liquor.  Do not drink on an empty stomach.  Keep yourself hydrated. Have water, diet soda, or unsweetened iced tea.  Regular soda, juice, and other mixers might contain a lot of carbohydrates and should be counted.  What foods are not recommended? As you make food choices, it is important to remember that all foods are not the same. Some foods have fewer nutrients per serving than other foods, even though they might have the same number of calories or carbohydrates. It is difficult to get your body what it needs when you eat foods with fewer nutrients. Examples of foods that you should avoid that are high in calories and carbohydrates but low in nutrients include:  Trans fats (most processed foods list trans fats on the Nutrition Facts label).  Regular soda.  Juice.  Candy.  Sweets, such as cake, pie, doughnuts, and cookies.  Fried foods.  What foods can I eat? Eat nutrient-rich foods, which will nourish your body and keep you healthy. The food you should eat also will depend on several factors, including:  The calories you need.  The medicines you take.  Your weight.  Your blood glucose level.  Your blood pressure level.  Your cholesterol level.  You should eat a variety of foods, including:  Protein. ? Lean cuts of meat. ? Proteins low in saturated fats, such as fish, egg whites, and beans. Avoid processed meats.  Fruits and vegetables. ? Fruits and vegetables that may help control blood glucose levels, such as apples, mangoes, and yams.  Dairy products. ? Choose fat-free or low-fat dairy products, such as milk, yogurt, and cheese.  Grains, bread, pasta, and rice. ? Choose whole grain products, such as multigrain bread, whole oats, and brown rice. These foods may  help control blood pressure.  Fats. ? Foods containing healthful fats, such as nuts, avocado, olive oil, canola oil, and fish.  Does everyone with diabetes mellitus have the same meal plan? Because every person with diabetes mellitus is different, there is not one meal plan that works for everyone. It is very important that you meet with a dietitian who will help you create a meal plan that is just right for you. This information is not intended to replace advice given to you by your health care provider. Make sure you discuss any questions you have with your health care provider.  Document Released: 05/22/2005 Document Revised: 01/31/2016 Document Reviewed: 07/22/2013 Elsevier Interactive Patient Education  2017 [employer].

## 2017-07-27 NOTE — Progress Notes (Signed)
Discharge home. Home discharge instruction given, no question verbalized. 

## 2017-07-27 NOTE — Discharge Summary (Signed)
Physician Discharge Summary  Christy Cole YSA:[id number] DOB: 1966/03/12 DOA: 07/25/2017  PCP: Patient, No Pcp Per  Admit date: 07/25/2017 Discharge date: 07/27/2017  Admitted From: Home Disposition:  Home  Recommendations for Outpatient Follow-up:  1. Follow up with PCP in 1 week. Appointment set up for [facility] on 11/28 at 2pm.  Discharge Condition: Stable CODE STATUS: Full  Diet recommendation: Carb modified  Brief/Interim Summary: Christy Cole a 51 y.o.femalewith medical history significant ofDM off meds for over 2 years comes in with several days of nausea and vomiting and not feeling well. She stopped taking her meds 2 years ago and has not followed up with a doctor since. She has been having a headache and been using a kerosene heater in her home. Pt found to have sugar over 700 and gap of 24. Referred for admission for DKA.  Discharge Diagnoses:  Principal Problem:   DKA (diabetic ketoacidoses) ([place]) Active Problems:   AKI (acute kidney injury) ([place])   DKA -Ha1c 12.9 -Anion gap now closed, off insulin gtt -DM coordinator consult  -Education provided. Insulin, syringe, glucometer, supplies provided   AKI -Resolved with IVF   ?CO poisoning -Blood carbon monoxide level ordered at time of discharge, pending -Low suspicion. Patient's clinical status and symptoms improved with treatment of DKA   Cocaine abuse -UDS positive for cocaine    Discharge Instructions  Discharge Instructions    Call MD for:  difficulty breathing, headache or visual disturbances   Complete by:  As directed    Call MD for:  extreme fatigue   Complete by:  As directed    Call MD for:  hives   Complete by:  As directed    Call MD for:  persistant dizziness or light-headedness   Complete by:  As directed    Call MD for:  persistant nausea and vomiting   Complete by:  As directed    Call MD for:  severe uncontrolled pain   Complete by:  As  directed    Call MD for:  temperature >100.4   Complete by:  As directed    Diet Carb Modified   Complete by:  As directed    Discharge instructions   Complete by:  As directed    You were cared for by a hospitalist during your hospital stay. If you have any questions about your discharge medications or the care you received while you were in the hospital after you are discharged, you can call the unit and asked to speak with the hospitalist on call if the hospitalist that took care of you is not available. Once you are discharged, your primary care physician will handle any further medical issues. Please note that NO REFILLS for any discharge medications will be authorized once you are discharged, as it is imperative that you return to your primary care physician (or establish a relationship with a primary care physician if you do not have one) for your aftercare needs so that they can reassess your need for medications and monitor your lab values.   Increase activity slowly   Complete by:  As directed      Allergies as of 07/27/2017      Reactions   Sulfa Antibiotics Rash   Rash around mouth      Medication List    TAKE these medications   albuterol 108 (90 Base) MCG/ACT inhaler Commonly known as:  PROVENTIL HFA;VENTOLIN HFA Inhale every 6 (six) hours as needed into  the lungs for wheezing or shortness of breath.   BC HEADACHE POWDER PO Take 1 packet 2 (two) times daily as needed by mouth (headache).   blood glucose meter kit and supplies Dispense based on patient and insurance preference. Use up to four times daily as directed. (FOR ICD-9 250.00, 250.01).   insulin detemir 100 UNIT/ML injection Commonly known as:  LEVEMIR Inject 0.25 mLs (25 Units total) at bedtime into the skin.   insulin lispro 100 UNIT/ML injection Commonly known as:  HUMALOG BG 121 - 150: 2 units, BG 151 - 200: 3 units, BG 201 - 250: 5 units, BG 251 - 300: 8 units, BG 301 - 350: 11 units, BG 351 - 400: 15  units   INSULIN SYRINGE .3CC/31GX5/16" 31G X 5/16" 0.3 ML Misc Use up to 4 times daily with insulin      Follow-up Information    [place].   Why:  Appointment August 05, 2017 at 2 pm  Contact information: [place] [phone]         Allergies  Allergen Reactions  . Sulfa Antibiotics Rash    Rash around mouth    Consultations:  None   Procedures/Studies: Dg Chest 2 View  Result Date: 07/25/2017 CLINICAL DATA:  Vomiting for 24 hours. EXAM: CHEST  2 VIEW COMPARISON:  None. FINDINGS: There is a torturous thoracic aorta. The heart, hila, and mediastinum are otherwise normal. No pneumothorax. No suspicious nodules, masses, or focal infiltrates. IMPRESSION: No active cardiopulmonary disease. Electronically Signed   By: Dorise Bullion III M.D   On: 07/25/2017 17:01       Discharge Exam: Vitals:   07/26/17 2111 07/27/17 0544  BP: 130/62 125/77  Pulse: 94 90  Resp: 18   Temp: 97.8 F (36.6 C) 98.7 F (37.1 C)  SpO2: 96% 97%   Vitals:   07/26/17 1400 07/26/17 1451 07/26/17 2111 07/27/17 0544  BP: 132/73 (!) 144/74 130/62 125/77  Pulse: 90 94 94 90  Resp: _0 Temp:  98.6 F (37 C) 97.8 F (36.6 C) 98.7 F (37.1 C)  TempSrc:  Oral Oral Oral  SpO2: 96% 99% 96% 97%  Weight:  77.8 kg (171 lb 8.3 oz)    Height:  _1  (1.626 m)      General: Pt is alert, awake, not in acute distress Cardiovascular: RRR, S1/S2 +, no rubs, no gallops Respiratory: CTA bilaterally, no wheezing, no rhonchi Abdominal: Soft, NT, ND, bowel sounds + Extremities: no edema, no cyanosis    The results of significant diagnostics from this hospitalization (including imaging, microbiology, ancillary and laboratory) are listed below for reference.     Microbiology: No results found for this or any previous visit (from the past 240 hour(s)).   Labs: BNP (last 3 results) No results for  input(s): BNP in the last 8760 hours. Basic Metabolic Panel: Recent Labs  Lab 07/25/17 1533  07/25/17 1816 07/25/17 2234 07/26/17 0205 07/26/17 0833 07/27/17 0433  NA 135   < > 142 141 138 137 137  K 5.1   < > 4.8 4.1 3.9 3.4* 4.2  CL 99*   < > 112* 112* 112* 110 106  CO2 12*  --   --  14* 21* 20* 18*  GLUCOSE 811*   < > 696* 319* 177* 173* 296*  BUN 39*   < > 43* 45* 44* 31* 14  CREATININE 2.01*   < >  1.00 1.46* 1.07* 0.81 0.82  CALCIUM 9.1  --   --  8.8* 8.5* 8.5* 8.6*  MG  --   --   --  2.1  --   --  2.0   < > = values in this interval not displayed.   Liver Function Tests: Recent Labs  Lab 07/25/17 1533  AST 11*  ALT 16  ALKPHOS 134*  BILITOT 1.9*  PROT 8.4*  ALBUMIN 3.9   Recent Labs  Lab 07/25/17 1533  LIPASE 25   No results for input(s): AMMONIA in the last 168 hours. CBC: Recent Labs  Lab 07/25/17 1533 07/25/17 1626 07/25/17 1816 07/25/17 2234 07/27/17 0433  WBC 14.8*  --   --  16.6* 9.7  HGB 17.2* 17.7* 16.0* 15.6* 14.2  HCT 51.5* 52.0* 47.0* 45.0 43.1  MCV 89.9  --   --  86.9 88.7  PLT 360  --   --  314 283   Cardiac Enzymes: No results for input(s): CKTOTAL, CKMB, CKMBINDEX, TROPONINI in the last 168 hours. BNP: Invalid input(s): POCBNP CBG: Recent Labs  Lab 07/26/17 1450 07/26/17 1739 07/26/17 2151 07/27/17 0756 07/27/17 1215  GLUCAP 233* 284* 265* 302* 230*   D-Dimer No results for input(s): DDIMER in the last 72 hours. Hgb A1c Recent Labs    07/25/17 2234  HGBA1C 12.9*   Lipid Profile No results for input(s): CHOL, HDL, LDLCALC, TRIG, CHOLHDL, LDLDIRECT in the last 72 hours. Thyroid function studies No results for input(s): TSH, T4TOTAL, T3FREE, THYROIDAB in the last 72 hours.  Invalid input(s): FREET3 Anemia work up No results for input(s): VITAMINB12, FOLATE, FERRITIN, TIBC, IRON, RETICCTPCT in the last 72 hours. Urinalysis No results found for: COLORURINE, APPEARANCEUR, LABSPEC, [place], GLUCOSEU, HGBUR, BILIRUBINUR,  KETONESUR, PROTEINUR, UROBILINOGEN, NITRITE, LEUKOCYTESUR Sepsis Labs Invalid input(s): PROCALCITONIN,  WBC,  LACTICIDVEN Microbiology No results found for this or any previous visit (from the past 240 hour(s)).   Time coordinating discharge: 40 minutes  SIGNED:  Dessa Phi, DO [facility] Pager [phone]  If 7PM-7AM, please contact night-coverage [email or web address] Password [email or web address] 07/27/2017, 1:53 PM

## 2017-08-05 ENCOUNTER — Inpatient Hospital Stay: Payer: 59

## 2019-04-21 IMAGING — CR DG CHEST 2V
2 series · 2 of 2 positions shown · non-contrast
Comparison: None.

CLINICAL DATA: Vomiting for 24 hours.

EXAM:
CHEST  2 VIEW

[chest lat]
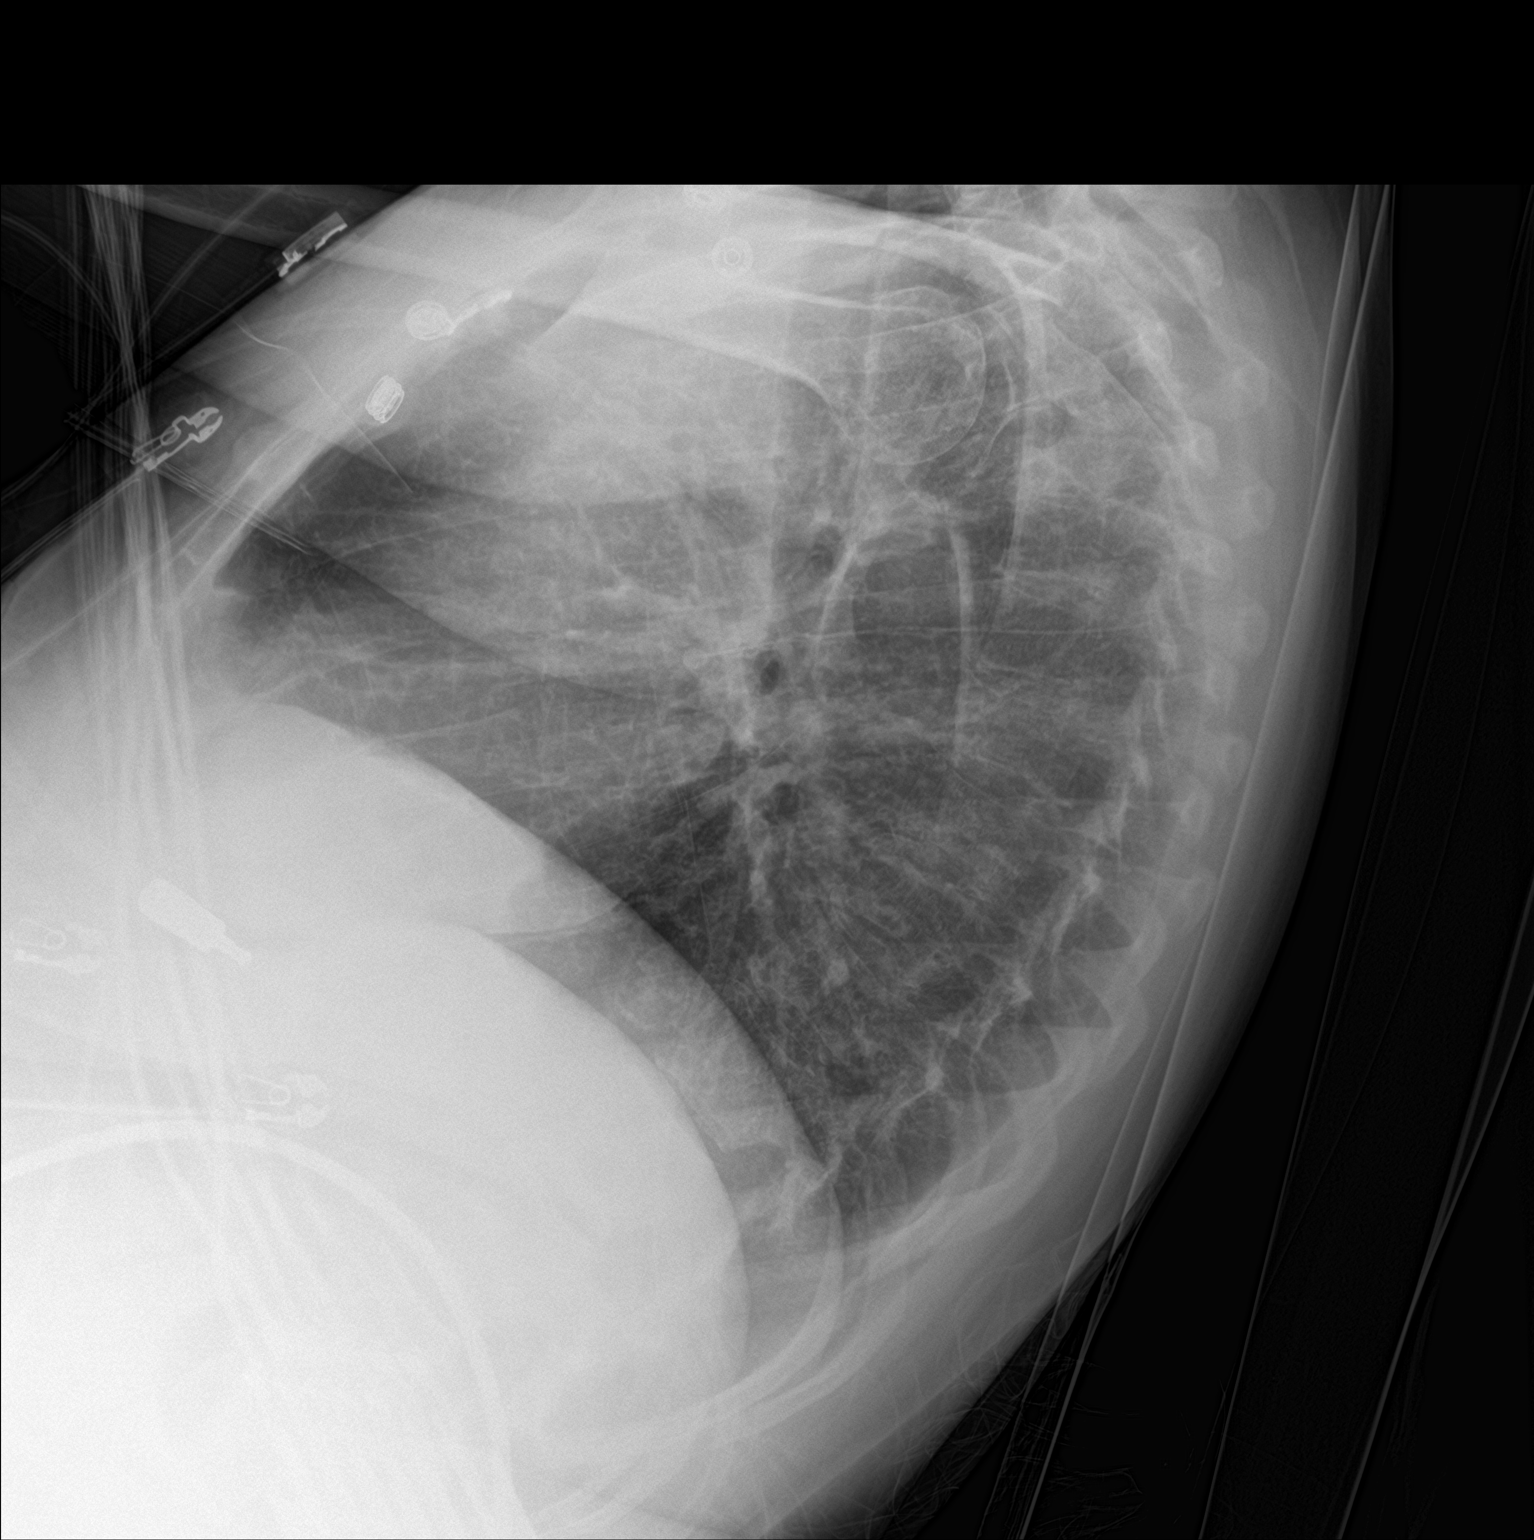

[chest ap]
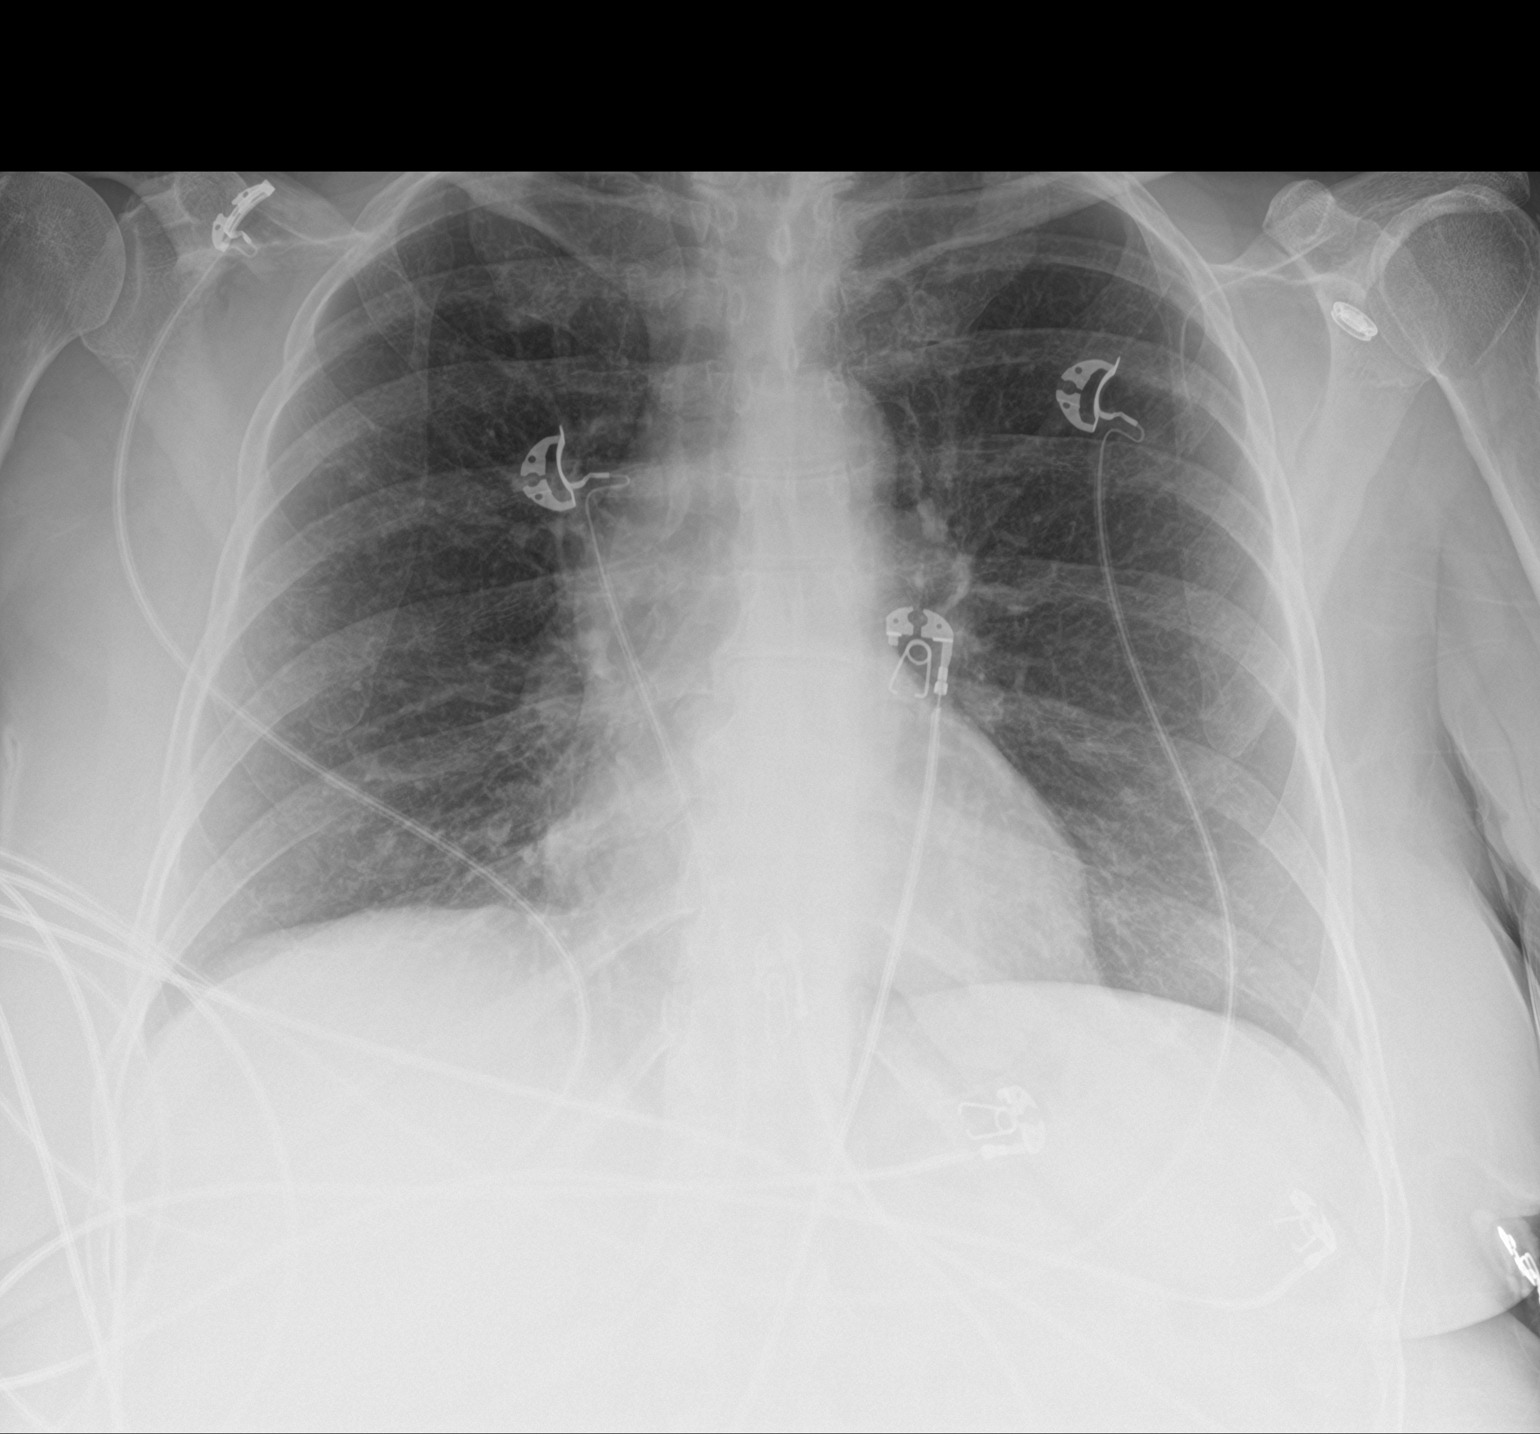

[2 of 2 positions shown; findings below may reference images not displayed]

FINDINGS: There is a torturous thoracic aorta. The heart, hila, and
mediastinum are otherwise normal. No pneumothorax. No suspicious
nodules, masses, or focal infiltrates.
IMPRESSION: No active cardiopulmonary disease.

## 2020-02-16 ENCOUNTER — Other Ambulatory Visit: Payer: Self-pay | Admitting: Family Medicine

## 2020-02-16 DIAGNOSIS — Z1231 Encounter for screening mammogram for malignant neoplasm of breast: Secondary | ICD-10-CM

## 2020-02-21 ENCOUNTER — Telehealth: Payer: Self-pay | Admitting: *Deleted

## 2020-02-21 NOTE — Telephone Encounter (Signed)
Attempted to schedule lung screening scan. However, patient reports quit date > 15 years ago. Made patient aware that eligibility requirements are currently a current smoking for if former, quit date must be within 15 years. Verbalizes understanding.

## 2024-10-10 ENCOUNTER — Emergency Department (HOSPITAL_COMMUNITY)
Admission: EM | Admit: 2024-10-10 | Discharge: 2024-10-11 | Disposition: A | Attending: Emergency Medicine | Admitting: Emergency Medicine

## 2024-10-10 ENCOUNTER — Encounter (HOSPITAL_COMMUNITY): Payer: Self-pay

## 2024-10-10 ENCOUNTER — Other Ambulatory Visit: Payer: Self-pay

## 2024-10-10 ENCOUNTER — Emergency Department (HOSPITAL_COMMUNITY)

## 2024-10-10 DIAGNOSIS — E119 Type 2 diabetes mellitus without complications: Secondary | ICD-10-CM | POA: Insufficient documentation

## 2024-10-10 DIAGNOSIS — Z7984 Long term (current) use of oral hypoglycemic drugs: Secondary | ICD-10-CM | POA: Insufficient documentation

## 2024-10-10 DIAGNOSIS — M542 Cervicalgia: Secondary | ICD-10-CM | POA: Insufficient documentation

## 2024-10-10 DIAGNOSIS — Z794 Long term (current) use of insulin: Secondary | ICD-10-CM | POA: Insufficient documentation

## 2024-10-10 DIAGNOSIS — R0789 Other chest pain: Secondary | ICD-10-CM | POA: Insufficient documentation

## 2024-10-10 DIAGNOSIS — Z7982 Long term (current) use of aspirin: Secondary | ICD-10-CM | POA: Insufficient documentation

## 2024-10-10 LAB — CBC
HCT: 36.2 % (ref 36.0–46.0)
Hemoglobin: 12.1 g/dL (ref 12.0–15.0)
MCH: 30 pg (ref 26.0–34.0)
MCHC: 33.4 g/dL (ref 30.0–36.0)
MCV: 89.8 fL (ref 80.0–100.0)
Platelets: 259 10*3/uL (ref 150–400)
RBC: 4.03 MIL/uL (ref 3.87–5.11)
RDW: 12.9 % (ref 11.5–15.5)
WBC: 10.6 10*3/uL — ABNORMAL HIGH (ref 4.0–10.5)
nRBC: 0 % (ref 0.0–0.2)

## 2024-10-10 LAB — BASIC METABOLIC PANEL WITH GFR
Anion gap: 12 (ref 5–15)
BUN: 16 mg/dL (ref 6–20)
CO2: 24 mmol/L (ref 22–32)
Calcium: 8.7 mg/dL — ABNORMAL LOW (ref 8.9–10.3)
Chloride: 104 mmol/L (ref 98–111)
Creatinine, Ser: 0.91 mg/dL (ref 0.44–1.00)
GFR, Estimated: >60 mL/min
Glucose, Bld: 117 mg/dL — ABNORMAL HIGH (ref 70–99)
Potassium: 3.7 mmol/L (ref 3.5–5.1)
Sodium: 139 mmol/L (ref 135–145)

## 2024-10-10 LAB — PRO BRAIN NATRIURETIC PEPTIDE: Pro Brain Natriuretic Peptide: 178 pg/mL

## 2024-10-10 LAB — TROPONIN T, HIGH SENSITIVITY: Troponin T High Sensitivity: <6 ng/L (ref 0–19)

## 2024-10-10 NOTE — ED Triage Notes (Signed)
 Patient BIB [facility] from home for CP starting around 2000. Patient took 324mg  aspirin prior to EMS arrival, also reports cough x 2 weeks and EMS reports chest wall is tender to palpation. Patient also has pacemaker and defibrillator.  BP 126/78 HR 70 96% RA RR 17 CBG 181 18g  R AC

## 2024-10-10 NOTE — ED Provider Notes (Signed)
 " [place] [facility]ERGENCY DEPARTMENT AT [facility] Provider Note   CSN: [id number] Arrival date & time: 10/10/24  2143     Patient presents with: Chest Pain   Christy Cole is a 59 y.o. female.  {Add pertinent medical, surgical, social history, OB history to HPI:32947} 59 y/o female with hx of NICM (LVEF 35% in 2024) s/p ICD, mild to moderate MR, DM, and HLD presents to the ED for c/o chest pain.  Patient states that pain began around 2000 this evening.  She describes a pressure-like, sharp pain that was present in her left upper chest, worse with pressing and movement.  It lasted approximately 30 seconds before spontaneously subsiding.  This pain continued to recur and was fleeting.  Patient subsequently developed some discomfort to the left side of her neck as well as some sharp pain in her left upper extremity.  She took 324 mg aspirin prior to arrival.  Received no other medications during transport.  She has not had any leg swelling, weight gain, fever, shortness of breath, diaphoresis, syncope/near syncope.  Has no history of CAD, ACS.  Does report history of fatal heart attack in father at the age of 64.     Chest Pain     Prior to Admission medications  Medication Sig Start Date End Date Taking? Authorizing Provider  acetaminophen -codeine  (TYLENOL  #3) 300-30 MG per tablet Take 1-2 tablets by mouth every 4 (four) hours as needed. 10/04/13   Lenon Juliane RAMAN, MD  albuterol (PROVENTIL HFA;VENTOLIN HFA) 108 (90 Base) MCG/ACT inhaler Inhale every 6 (six) hours as needed into the lungs for wheezing or shortness of breath.    [provider]  Aspirin-Salicylamide-Caffeine (BC HEADACHE POWDER PO) Take 1 packet 2 (two) times daily as needed by mouth (headache).    [provider]  blood glucose meter kit and supplies Dispense based on patient and insurance preference. Use up to four times daily as directed. (FOR ICD-9 250.00, 250.01). 07/27/17   Rojelio Nest,  DO  doxycycline  (VIBRA -TABS) 100 MG tablet Take 1 tablet (100 mg total) by mouth 2 (two) times daily. 10/06/13   Hadassah Powell HERO, PA-C  fluconazole  (DIFLUCAN ) 150 MG tablet Take 1 tablet (150 mg total) by mouth once. Repeat in 1 wk if needed 10/10/13   Egan, Eleanore E, PA-C  HYDROcodone -acetaminophen  (NORCO/VICODIN) 5-325 MG per tablet Take 1 tablet by mouth every 6 (six) hours as needed for pain. 02/18/13   Charla Medford ORN, MD  insulin  detemir (LEVEMIR ) 100 UNIT/ML injection Inject 0.25 mLs (25 Units total) at bedtime into the skin. 07/27/17 09/05/17  Rojelio Nest, DO  insulin  lispro (HUMALOG ) 100 UNIT/ML injection BG 121 - 150: 2 units, BG 151 - 200: 3 units, BG 201 - 250: 5 units, BG 251 - 300: 8 units, BG 301 - 350: 11 units, BG 351 - 400: 15 units 07/27/17 07/27/18  Rojelio Nest, DO  Insulin  Syringe-Needle U-100 (INSULIN  SYRINGE .3CC/31GX5/16) 31G X 5/16 0.3 ML MISC Use up to 4 times daily with insulin  07/27/17   Rojelio Nest, DO  ketoconazole  (NIZORAL ) 2 % shampoo Apply topically 2 (two) times a week. 02/18/13   Charla Medford ORN, MD  metFORMIN (GLUCOPHAGE) 1000 MG tablet Take 1,000 mg by mouth 2 (two) times daily with a meal.    [provider]    Allergies: Bactrim [sulfamethoxazole -trimethoprim] and Sulfa antibiotics    Review of Systems  Cardiovascular:  Positive for chest pain.  Ten systems reviewed and are negative for  acute change, except as noted in the HPI.    Updated Vital Signs BP (!) 153/79 (BP Location: Left Arm)   Pulse 71   Temp 98 F (36.7 C) (Oral)   Resp 17   Ht 5' 4 (1.626 m)   Wt 92.1 kg   SpO2 97%   BMI 34.84 kg/m   Physical Exam Vitals and nursing note reviewed.  Constitutional:      General: She is not in acute distress.    Appearance: She is well-developed. She is not diaphoretic.     Comments: Nontoxic appearing and in NAD  HENT:     Head: Normocephalic and atraumatic.  Eyes:     General: No scleral icterus.    Conjunctiva/sclera:  Conjunctivae normal.  Cardiovascular:     Rate and Rhythm: Normal rate and regular rhythm.     Pulses: Normal pulses.  Pulmonary:     Effort: Pulmonary effort is normal. No respiratory distress.     Breath sounds: No stridor. No wheezing.     Comments: Respirations even and unlabored. Lungs CTAB. Musculoskeletal:        General: Normal range of motion.     Cervical back: Normal range of motion.     Comments: No pitting BLE edema  Skin:    General: Skin is warm and dry.     Coloration: Skin is not pale.     Findings: No erythema or rash.  Neurological:     Mental Status: She is alert and oriented to person, place, and time.  Psychiatric:        Behavior: Behavior normal.     (all labs ordered are listed, but only abnormal results are displayed) Labs Reviewed  BASIC METABOLIC PANEL WITH GFR - Abnormal; Notable for the following components:      Result Value   Glucose, Bld 117 (*)    Calcium 8.7 (*)    All other components within normal limits  CBC - Abnormal; Notable for the following components:   WBC 10.6 (*)    All other components within normal limits  PRO BRAIN NATRIURETIC PEPTIDE  MAGNESIUM   TROPONIN T, HIGH SENSITIVITY  TROPONIN T, HIGH SENSITIVITY    EKG: None  Radiology: DG Chest 2 View Result Date: 10/10/2024 EXAM: 2 VIEW(S) XRAY OF THE CHEST 10/10/2024 10:57:00 PM COMPARISON: 11 / 17 / 18 CLINICAL HISTORY: Chest pain. FINDINGS: LINES, TUBES AND DEVICES: Left chest AICD with leads overlying Right Atrium, Right Ventricle, and Coronary Vein. LUNGS AND PLEURA: No focal consolidation . No pleural effusion. No pneumothorax. HEART AND MEDIASTINUM: No acute abnormality of the cardiac and mediastinal silhouettes. BONES AND SOFT TISSUES: No acute osseous abnormality. IMPRESSION: 1. No acute cardiopulmonary abnormality. Electronically signed by: Norman Gatlin MD 10/10/2024 11:06 PM EST RP Workstation: [id number]    {Document cardiac monitor, telemetry assessment procedure  when appropriate:32947} Procedures   Medications Ordered in the ED - No data to display    {Click here for ABCD2, HEART and other calculators REFRESH Note before signing:1}                              Medical Decision Making Amount and/or Complexity of Data Reviewed Labs: ordered. Radiology: ordered.   ***  {Document critical care time when appropriate  Document review of labs and clinical decision tools ie CHADS2VASC2, etc  Document your independent review of radiology images and any outside records  Document your discussion with family members,  caretakers and with consultants  Document social determinants of health affecting pt's care  Document your decision making why or why not admission, treatments were needed:32947:::1}   Final diagnoses:  None    ED Discharge Orders     None        "

## 2024-10-11 LAB — MAGNESIUM: Magnesium: 1.4 mg/dL — ABNORMAL LOW (ref 1.7–2.4)

## 2024-10-11 LAB — TROPONIN T, HIGH SENSITIVITY: Troponin T High Sensitivity: <6 ng/L (ref 0–19)

## 2024-10-11 MED ORDER — MAGNESIUM SULFATE 2 GM/50ML IV SOLN
2.0000 g | INTRAVENOUS | Status: AC
  Administered 2024-10-11: 2 g via INTRAVENOUS
  Filled 2024-10-11: qty 50

## 2024-10-11 NOTE — ED Notes (Signed)
 Pt expressed she is tired of waiting. Will be leaving and will not wait any longer. RN aware.

## 2024-10-11 NOTE — ED Provider Notes (Signed)
" °  Physical Exam  BP (!) 117/45   Pulse 72   Temp (!) 97.5 F (36.4 C)   Resp (!) 22   Ht 5' 4 (1.626 m)   Wt 92.1 kg   SpO2 99%   BMI 34.84 kg/m   Physical Exam Vitals and nursing note reviewed.  Constitutional:      Appearance: Normal appearance.  HENT:     Head: Normocephalic and atraumatic.     Mouth/Throat:     Mouth: Mucous membranes are moist.  Eyes:     General: No scleral icterus.       Right eye: No discharge.        Left eye: No discharge.     Conjunctiva/sclera: Conjunctivae normal.  Cardiovascular:     Rate and Rhythm: Normal rate and regular rhythm.     Pulses: Normal pulses.  Pulmonary:     Effort: Pulmonary effort is normal.     Breath sounds: Normal breath sounds.  Abdominal:     General: There is no distension.     Tenderness: There is no abdominal tenderness.  Musculoskeletal:        General: No deformity.     Cervical back: Normal range of motion.  Skin:    General: Skin is warm and dry.     Capillary Refill: Capillary refill takes less than 2 seconds.  Neurological:     Mental Status: She is alert.     Motor: No weakness.  Psychiatric:        Mood and Affect: Mood normal.     Procedures  Procedures  ED Course / MDM   Clinical Course as of 10/11/24 0637  Tue Oct 11, 2024  9951 Low magnesium . Will replete. HDS with negative troponin x2. CXR reassuring. [KH]  0057 Inquired with nursing about pacemaker interrogation. [KH]    Clinical Course User Index [KH] Keith Sor, PA-C   Medical Decision Making Amount and/or Complexity of Data Reviewed Labs: ordered. Radiology: ordered.  Risk Prescription drug management.   Patient was received in handoff from overnight APP.  Please see her note for full HPI.  Briefly, patient presented to the emergency department for evaluation of atypical chest pain.  She does have a history of ischemic cardiomyopathy.  Patient was asymptomatic at the time of handoff, just waiting on a Consulting Civil Engineer.  The overnight rep was 3+ hours away, so plan will was for daytime rep to evaluate.  Patient was also found to be hypomagnesemic.  This was repleted overnight.  At 10:16 AM the rep had not yet arrived and patient was requesting to leave.  Given reassuring workup, and patient's lack of symptoms, I am comfortable with this plan.  I did verbalize to the patient that she will need to follow-up with her cardiologist to soon as possible, which she agreed.  Her vitals are otherwise stable.  Patient is appropriate for discharge at this time.      Torrence Marry RAMAN, PA-C 10/11/24 1017  "

## 2024-10-11 NOTE — ED Notes (Signed)
 RN updated pt on next steps. Pt denies any pain or needs at this time.

## 2024-11-21 ENCOUNTER — Ambulatory Visit: Payer: Self-pay
